# Patient Record
Sex: Female | Born: 1965 | Race: Black or African American | Hispanic: No | Marital: Married | Smoking: Never smoker
Health system: Southern US, Community
[De-identification: ages and names within clinical notes are randomized; demographics above are authoritative.]

---

## 2012-01-18 DIAGNOSIS — K5900 Constipation, unspecified: Secondary | ICD-10-CM | POA: Insufficient documentation

## 2012-01-19 DIAGNOSIS — H93299 Other abnormal auditory perceptions, unspecified ear: Secondary | ICD-10-CM | POA: Insufficient documentation

## 2012-01-19 DIAGNOSIS — H9209 Otalgia, unspecified ear: Secondary | ICD-10-CM | POA: Insufficient documentation

## 2012-01-19 DIAGNOSIS — M26609 Unspecified temporomandibular joint disorder, unspecified side: Secondary | ICD-10-CM | POA: Insufficient documentation

## 2012-02-06 DIAGNOSIS — E162 Hypoglycemia, unspecified: Secondary | ICD-10-CM | POA: Insufficient documentation

## 2012-02-06 DIAGNOSIS — N951 Menopausal and female climacteric states: Secondary | ICD-10-CM | POA: Insufficient documentation

## 2012-03-21 DIAGNOSIS — N390 Urinary tract infection, site not specified: Secondary | ICD-10-CM | POA: Insufficient documentation

## 2012-03-22 DIAGNOSIS — R3129 Other microscopic hematuria: Secondary | ICD-10-CM | POA: Insufficient documentation

## 2012-03-22 DIAGNOSIS — M545 Low back pain, unspecified: Secondary | ICD-10-CM | POA: Insufficient documentation

## 2012-08-23 DIAGNOSIS — M129 Arthropathy, unspecified: Secondary | ICD-10-CM | POA: Insufficient documentation

## 2012-08-23 DIAGNOSIS — H04129 Dry eye syndrome of unspecified lacrimal gland: Secondary | ICD-10-CM | POA: Insufficient documentation

## 2012-08-23 DIAGNOSIS — M25549 Pain in joints of unspecified hand: Secondary | ICD-10-CM | POA: Insufficient documentation

## 2013-03-25 DIAGNOSIS — N644 Mastodynia: Secondary | ICD-10-CM | POA: Insufficient documentation

## 2013-03-25 DIAGNOSIS — N63 Unspecified lump in unspecified breast: Secondary | ICD-10-CM | POA: Insufficient documentation

## 2013-07-08 DIAGNOSIS — K219 Gastro-esophageal reflux disease without esophagitis: Secondary | ICD-10-CM | POA: Insufficient documentation

## 2013-07-18 DIAGNOSIS — R109 Unspecified abdominal pain: Secondary | ICD-10-CM | POA: Insufficient documentation

## 2013-11-07 DIAGNOSIS — N920 Excessive and frequent menstruation with regular cycle: Secondary | ICD-10-CM | POA: Insufficient documentation

## 2015-05-25 ENCOUNTER — Ambulatory Visit (INDEPENDENT_AMBULATORY_CARE_PROVIDER_SITE_OTHER): Payer: Self-pay | Admitting: Emergency Medicine

## 2015-05-25 VITALS — BP 110/60 | HR 58 | Temp 98.8°F | Resp 16 | Ht 67.25 in | Wt 155.0 lb

## 2015-05-25 DIAGNOSIS — Z024 Encounter for examination for driving license: Secondary | ICD-10-CM

## 2015-05-25 DIAGNOSIS — Z021 Encounter for pre-employment examination: Secondary | ICD-10-CM

## 2015-05-25 NOTE — Progress Notes (Signed)
   Subjective:  Patient ID: Marissa Coleman, female    DOB: 1966-04-23  Age: 49 y.o. MRN: [id number]  CC: Employment Physical   HPI Marissa PodCarles Kuennen presents  for a DOT physical she takes no medication has no medical problems.  History Paisli has no past medical history on file.   She has no past surgical history on file.   Her  family history is not on file.  She   reports that she has never smoked. She does not have any smokeless tobacco history on file. Her alcohol and drug histories are not on file.  No outpatient prescriptions prior to visit.   No facility-administered medications prior to visit.    History   Social History  . Marital Status: Divorced    Spouse Name: N/A  . Number of Children: N/A  . Years of Education: N/A   Social History Main Topics  . Smoking status: Never Smoker   . Smokeless tobacco: Not on file  . Alcohol Use: Not on file  . Drug Use: Not on file  . Sexual Activity: Not on file   Other Topics Concern  . None   Social History Narrative  . None     Review of Systems   Review of systems was unremarkable  Objective:  BP 110/60 mmHg  Pulse 58  Temp(Src) 98.8 F (37.1 C) (Oral)  Resp 16  Ht 5' 7.25" (1.708 m)  Wt 155 lb (70.308 kg)  BMI 24.10 kg/m2  SpO2 94%  LMP 05/19/2015  Physical Exam  Constitutional: She is oriented to person, place, and time. She appears well-developed and well-nourished. No distress.  HENT:  Head: Normocephalic and atraumatic.  Right Ear: External ear normal.  Left Ear: External ear normal.  Nose: Nose normal.  Eyes: Conjunctivae and EOM are normal. Pupils are equal, round, and reactive to light. No scleral icterus.  Neck: Normal range of motion. Neck supple. No tracheal deviation present.  Cardiovascular: Normal rate, regular rhythm and normal heart sounds.   Pulmonary/Chest: Effort normal. No respiratory distress. She has no wheezes. She has no rales.  Abdominal: She exhibits no mass. There is no  tenderness. There is no rebound and no guarding.  Musculoskeletal: She exhibits no edema.  Lymphadenopathy:    She has no cervical adenopathy.  Neurological: She is alert and oriented to person, place, and time. Coordination normal.  Skin: Skin is warm and dry. No rash noted.  Psychiatric: She has a normal mood and affect. Her behavior is normal.      Assessment & Plan:   There are no diagnoses linked to this encounter. I am having Ms. Schiffer maintain her Vitamin D3.  Meds ordered this encounter  Medications  . Cholecalciferol (VITAMIN D3) 3000 UNITS TABS    Sig: Take by mouth.    Appropriate red flag conditions were discussed with the patient as well as actions that should be taken.  Patient expressed his understanding.  Follow-up: No Follow-up on file.  Carmelina DaneAnderson, Oneil Behney S, MD

## 2016-03-09 ENCOUNTER — Other Ambulatory Visit: Payer: Self-pay | Admitting: Specialist

## 2016-03-09 DIAGNOSIS — N6310 Unspecified lump in the right breast, unspecified quadrant: Secondary | ICD-10-CM

## 2016-03-16 ENCOUNTER — Other Ambulatory Visit: Payer: Self-pay

## 2016-03-29 ENCOUNTER — Other Ambulatory Visit: Payer: Self-pay | Admitting: Specialist

## 2016-03-29 ENCOUNTER — Ambulatory Visit
Admission: RE | Admit: 2016-03-29 | Discharge: 2016-03-29 | Disposition: A | Discharge status: Home / Self Care | Payer: BC Managed Care – PPO | Source: Ambulatory Visit | Attending: Specialist | Admitting: Specialist

## 2016-03-29 DIAGNOSIS — N6310 Unspecified lump in the right breast, unspecified quadrant: Secondary | ICD-10-CM

## 2016-04-05 ENCOUNTER — Ambulatory Visit
Admission: RE | Admit: 2016-04-05 | Discharge: 2016-04-05 | Disposition: A | Discharge status: Home / Self Care | Payer: BC Managed Care – PPO | Source: Ambulatory Visit | Attending: Specialist | Admitting: Specialist

## 2016-04-05 DIAGNOSIS — N6310 Unspecified lump in the right breast, unspecified quadrant: Secondary | ICD-10-CM

## 2016-04-05 HISTORY — PX: BREAST CYST ASPIRATION: SHX578

## 2017-05-10 ENCOUNTER — Other Ambulatory Visit: Payer: Self-pay | Admitting: Specialist

## 2017-05-10 DIAGNOSIS — Z1231 Encounter for screening mammogram for malignant neoplasm of breast: Secondary | ICD-10-CM

## 2017-05-24 ENCOUNTER — Ambulatory Visit
Admission: RE | Admit: 2017-05-24 | Discharge: 2017-05-24 | Disposition: A | Discharge status: Home / Self Care | Payer: BC Managed Care – PPO | Source: Ambulatory Visit | Attending: Specialist | Admitting: Specialist

## 2017-05-24 DIAGNOSIS — Z1231 Encounter for screening mammogram for malignant neoplasm of breast: Secondary | ICD-10-CM

## 2017-05-28 ENCOUNTER — Other Ambulatory Visit: Payer: Self-pay | Admitting: Specialist

## 2017-05-28 DIAGNOSIS — R928 Other abnormal and inconclusive findings on diagnostic imaging of breast: Secondary | ICD-10-CM

## 2017-05-31 ENCOUNTER — Ambulatory Visit
Admission: RE | Admit: 2017-05-31 | Discharge: 2017-05-31 | Disposition: A | Discharge status: Home / Self Care | Payer: BLUE CROSS/BLUE SHIELD | Source: Ambulatory Visit | Attending: Specialist | Admitting: Specialist

## 2017-05-31 ENCOUNTER — Ambulatory Visit: Payer: BC Managed Care – PPO

## 2017-05-31 ENCOUNTER — Other Ambulatory Visit: Payer: Self-pay | Admitting: Specialist

## 2017-05-31 DIAGNOSIS — R928 Other abnormal and inconclusive findings on diagnostic imaging of breast: Secondary | ICD-10-CM

## 2018-05-09 IMAGING — MG 2D DIGITAL DIAGNOSTIC UNILATERAL LEFT MAMMOGRAM WITH CAD AND ADJ
6 series · 6 of 14 positions shown · non-contrast
Comparison: Previous exam(s).

CLINICAL DATA: Screening recall for a left breast asymmetry. The
patient states that she has had focal pain in the lateral aspect of
the left breast for the past few months. This was not mentioned to
the technologist at the time of her screening mammogram.

EXAM:
2D DIGITAL DIAGNOSTIC UNILATERAL LEFT MAMMOGRAM WITH CAD AND ADJUNCT
TOMO
LEFT BREAST ULTRASOUND

[L MLO]
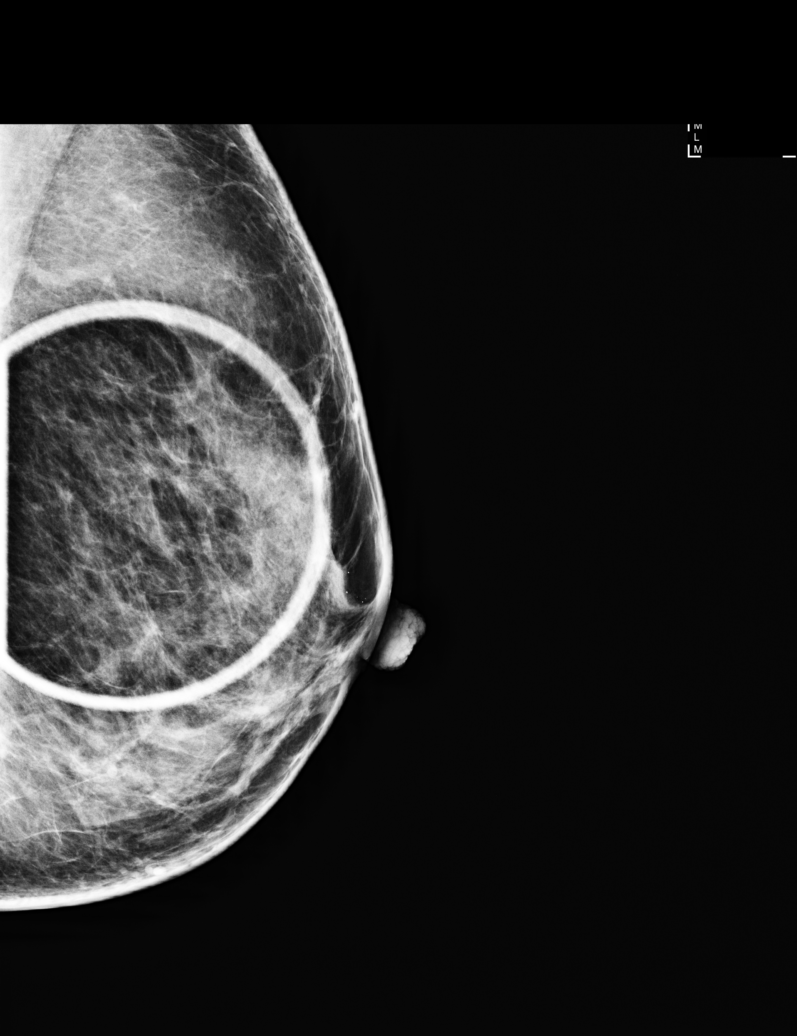

[L MLO synth-2D]
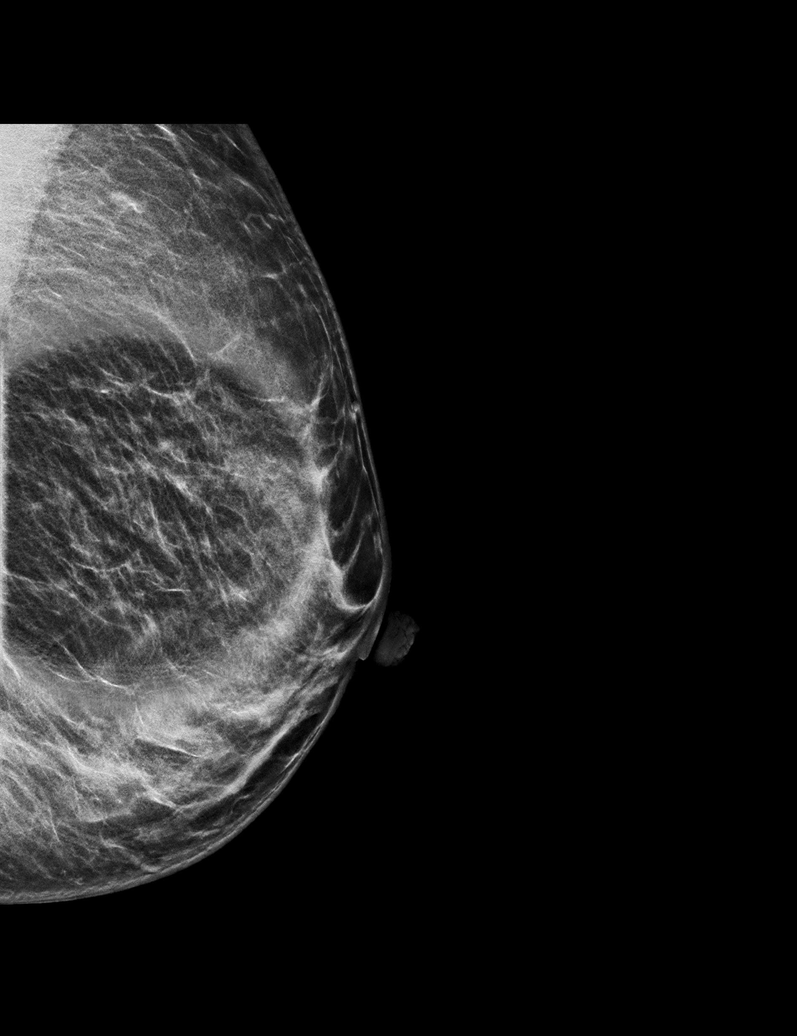

[L CC]
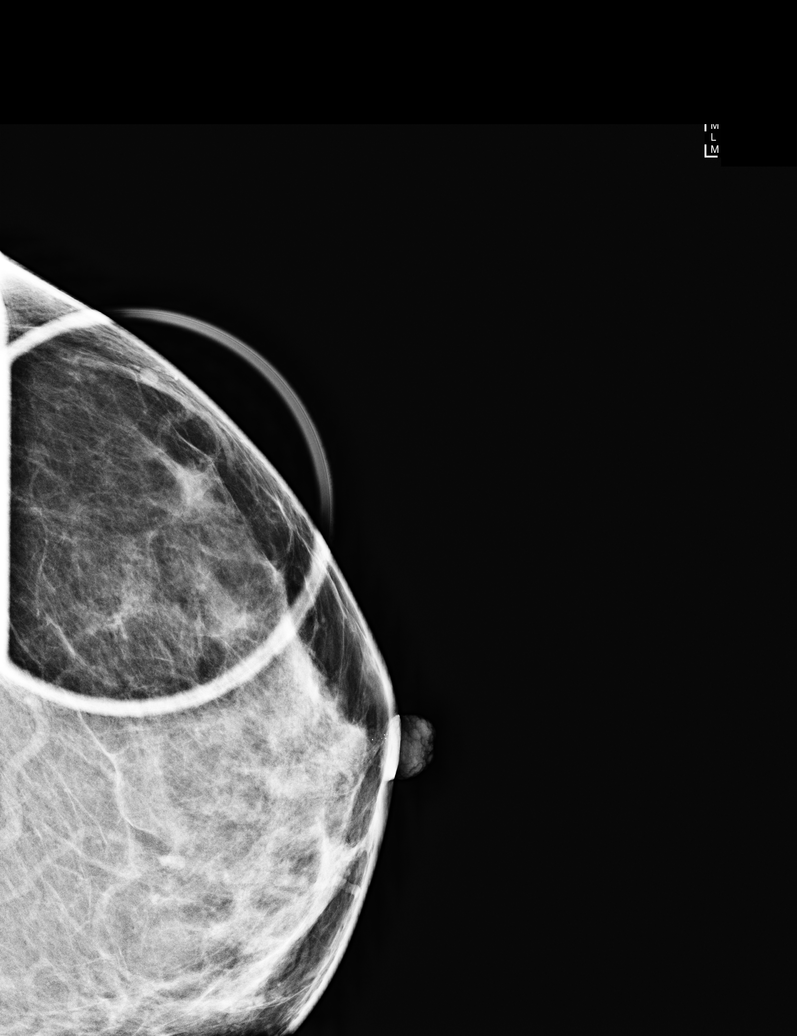

[L CC synth-2D]
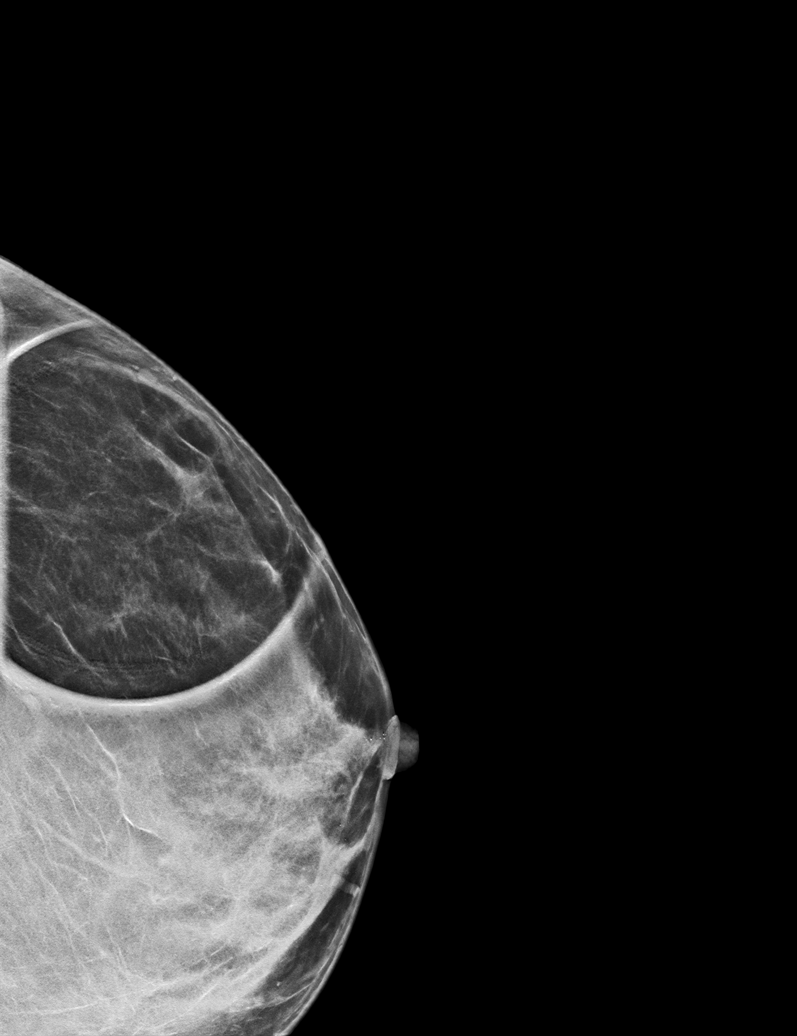

[L MLO tomo · tomo slice 15/28.0]
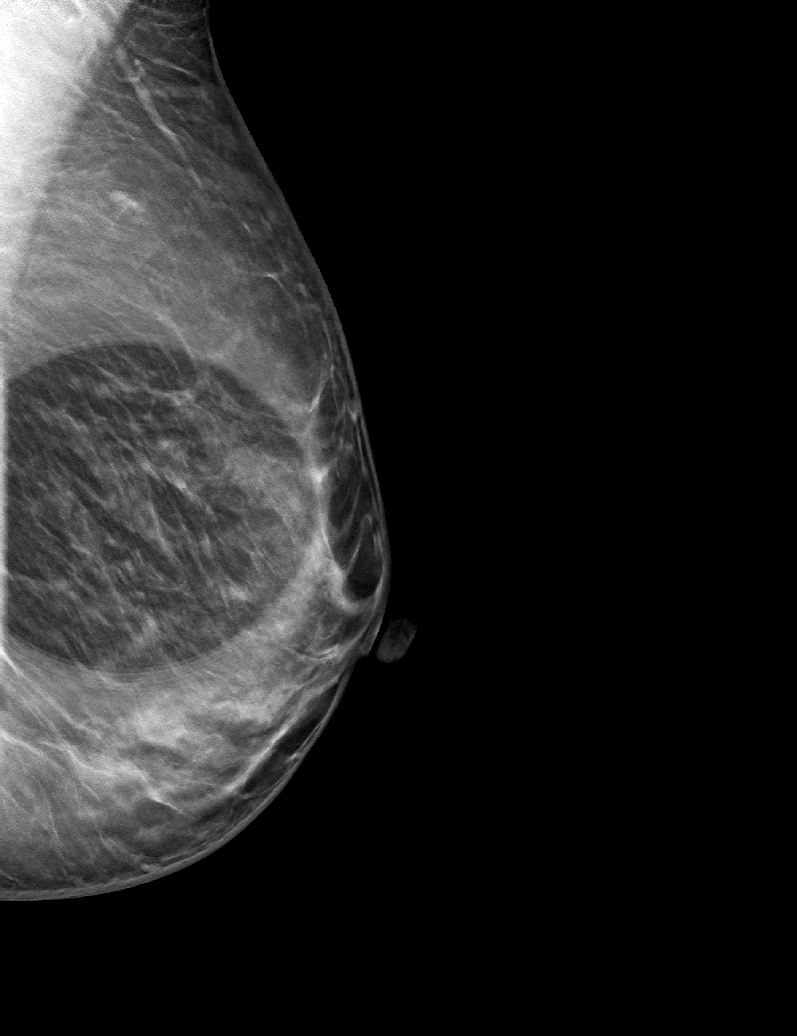

[L CC tomo · tomo slice 27/52.0]
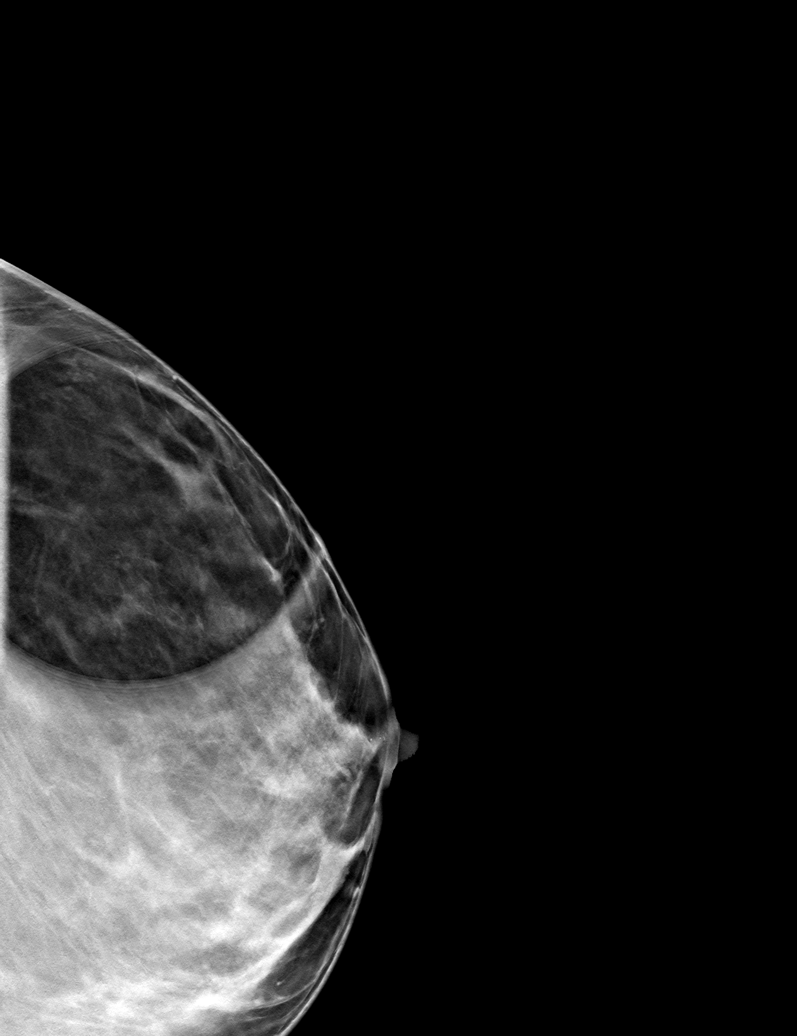

[6 of 14 positions shown; findings below may reference images not displayed]

ACR Breast Density Category c: The breast tissue is heterogeneously
dense, which may obscure small masses.
FINDINGS: The tissue in the lateral aspect of the left breast appears
unchanged. No suspicious calcifications, masses or areas of
distortion are seen in the left breast.

Mammographic images were processed with CAD.

Physical exam of the lateral left breast demonstrates no discrete
palpable masses, though significant tenderness is elicited with
palpation.

Ultrasound targeted to the lateral aspect of the left breast
demonstrates multiple small benign-appearing cysts. There is a
slightly ectatic duct without identified filling defect. No
suspicious masses or areas of shadowing are identified.
IMPRESSION: 1. The asymmetry of concern in the lateral left breast resolves
consistent with overlapping fibroglandular tissue.

2. Fibrocystic changes identified at the site of focal pain, which
may be a cause of pain.

RECOMMENDATION:
1. Clinical follow-up recommended for the painful area of concern in
the left breast. Any further workup should be based on clinical
grounds.

2.  Screening mammogram in one year.(Code:84-P-ECK)

I have discussed the findings and recommendations with the patient.
Results were also provided in writing at the conclusion of the
visit. If applicable, a reminder letter will be sent to the patient
regarding the next appointment.

BI-RADS CATEGORY  2: Benign.

## 2018-11-14 DIAGNOSIS — K5904 Chronic idiopathic constipation: Secondary | ICD-10-CM | POA: Insufficient documentation

## 2020-06-14 DIAGNOSIS — M4726 Other spondylosis with radiculopathy, lumbar region: Secondary | ICD-10-CM | POA: Insufficient documentation

## 2021-03-03 ENCOUNTER — Other Ambulatory Visit: Payer: Self-pay | Admitting: Family Medicine

## 2021-03-03 DIAGNOSIS — Z1231 Encounter for screening mammogram for malignant neoplasm of breast: Secondary | ICD-10-CM

## 2021-03-16 ENCOUNTER — Other Ambulatory Visit: Payer: Self-pay | Admitting: Family Medicine

## 2021-03-16 DIAGNOSIS — G4452 New daily persistent headache (NDPH): Secondary | ICD-10-CM

## 2021-03-16 DIAGNOSIS — H539 Unspecified visual disturbance: Secondary | ICD-10-CM

## 2021-03-29 ENCOUNTER — Other Ambulatory Visit: Payer: BLUE CROSS/BLUE SHIELD

## 2021-04-07 ENCOUNTER — Ambulatory Visit
Admission: RE | Admit: 2021-04-07 | Discharge: 2021-04-07 | Disposition: A | Discharge status: Home / Self Care | Payer: BC Managed Care – PPO | Source: Ambulatory Visit | Attending: Family Medicine | Admitting: Family Medicine

## 2021-04-07 ENCOUNTER — Other Ambulatory Visit: Payer: Self-pay

## 2021-04-07 DIAGNOSIS — H539 Unspecified visual disturbance: Secondary | ICD-10-CM

## 2021-04-07 DIAGNOSIS — G4452 New daily persistent headache (NDPH): Secondary | ICD-10-CM

## 2021-04-07 MED ORDER — GADOBENATE DIMEGLUMINE 529 MG/ML IV SOLN
15.0000 mL | Freq: Once | INTRAVENOUS | Status: AC | PRN
  Administered 2021-04-07: 15 mL via INTRAVENOUS

## 2021-04-24 NOTE — Progress Notes (Signed)
Cardiology Office Note:    Date:  04/26/2021   ID:  Marissa Coleman, DOB 1965-12-09, MRN [id number]  PCP:  Patient, No Pcp Per (Inactive)  Cardiologist:  None  Electrophysiologist:  None   Referring MD: Aliene BeamsHagler, Rachel, MD   Chief Complaint  Patient presents with   Palpitations    History of Present Illness:    Marissa Coleman is a 55 y.o. female with a hx of hypertension, migraines who is referred by Dr. Tracie HarrierHagler for evaluation of tachycardia.  She reports that she has been having episodes of palpitations.  Reports that she would wake up every morning with palpitations where she felt like her heart was racing.  Would last for about 15 to 20 minutes.  Would also occur a few times per week during the day.  No clear triggers.  She was started on metoprolol with improvement in her symptoms, now having palpitations 1-2 times per month.  She does not exercise due to back pain.  She denies any exertional chest pain but does report some dyspnea with exertion, which she attributes to deconditioning.  Reports has been having episodes of lightheadedness if she turns her head too fast.  Denies any syncopal episodes.  Has noted swelling in her legs and starting amlodipine.  Reports that she has been told she snores. Denies any caffeine, alcohol, or tobacco use.  No known history of heart disease in her immediate family.   No past medical history on file.  Past Surgical History:  Procedure Laterality Date   BREAST CYST ASPIRATION Right 04/05/2016    Current Medications: Current Meds  Medication Sig   amLODipine (NORVASC) 10 MG tablet 1 tablet   Cholecalciferol (VITAMIN D3) 3000 UNITS TABS Take by mouth.   cyclobenzaprine (FLEXERIL) 10 MG tablet 1 tablet at bedtime as needed   EPINEPHrine 0.3 mg/0.3 mL IJ SOAJ injection See admin instructions.   Magnesium Oxide 200 MG TABS See admin instructions.   metoprolol succinate (TOPROL-XL) 25 MG 24 hr tablet    Riboflavin 400 MG CAPS 1 capsule     Allergies:    Morphine, Penicillins, and Shellfish allergy   Social History   Socioeconomic History   Marital status: Divorced    Spouse name: Not on file   Number of children: Not on file   Years of education: Not on file   Highest education level: Not on file  Occupational History   Not on file  Tobacco Use   Smoking status: Never   Smokeless tobacco: Not on file  Substance and Sexual Activity   Alcohol use: Not on file   Drug use: Not on file   Sexual activity: Not on file  Other Topics Concern   Not on file  Social History Narrative   Not on file   Social Determinants of Health   Financial Resource Strain: Not on file  Food Insecurity: Not on file  Transportation Needs: Not on file  Physical Activity: Not on file  Stress: Not on file  Social Connections: Not on file     Family History: The patient's family history includes Breast cancer (age of onset: 6945) in her maternal aunt; Breast cancer (age of onset: 1450) in her mother.  ROS:   Please see the history of present illness.     All other systems reviewed and are negative.  EKGs/Labs/Other Studies Reviewed:    The following studies were reviewed today:   EKG:  EKG is ordered today.  The ekg ordered today demonstrates normal sinus  rhythm, rate 62, low voltage, poor R wave progression  Recent Labs: No results found for requested labs within last 8760 hours.  Recent Lipid Panel No results found for: CHOL, TRIG, HDL, CHOLHDL, VLDL, LDLCALC, LDLDIRECT  Physical Exam:    VS:  BP 124/72   Pulse 62   Ht 5\' 5"  (1.651 m)   Wt 213 lb 3.2 oz (96.7 kg)   SpO2 98%   BMI 35.48 kg/m     Wt Readings from Last 3 Encounters:  04/26/21 213 lb 3.2 oz (96.7 kg)  05/25/15 155 lb (70.3 kg)     GEN:  Well nourished, well developed in no acute distress HEENT: Normal NECK: No JVD; No carotid bruits LYMPHATICS: No lymphadenopathy CARDIAC: RRR, no murmurs, rubs, gallops RESPIRATORY:  Clear to auscultation without rales, wheezing or  rhonchi  ABDOMEN: Soft, non-tender, non-distended MUSCULOSKELETAL:  No edema; No deformity  SKIN: Warm and dry NEUROLOGIC:  Alert and oriented x 3 PSYCHIATRIC:  Normal affect   ASSESSMENT:    1. Palpitations   2. Shortness of breath   3. Snoring   4. Essential hypertension    PLAN:    Palpitations: Description concerning for arrhythmia.  Will evaluate with Zio patch x2 weeks.  Recommend holding Toprol-XL while wearing heart monitor.  Given she has been waking up with palpitations, question whether undiagnosed OSA is causing her symptoms.  We will check sleep study  Dyspnea on exertion: Check echocardiogram to evaluate for structural heart disease  Hypertension: On amlodipine 10 mg daily and Toprol-XL 25 mg daily.  Appears controlled.  Snoring: Check sleep study as above.    RTC in 3 months  Medication Adjustments/Labs and Tests Ordered: Current medicines are reviewed at length with the patient today.  Concerns regarding medicines are outlined above.  Orders Placed This Encounter  Procedures   LONG TERM MONITOR (3-14 DAYS)   EKG 12-Lead   ECHOCARDIOGRAM COMPLETE   Split night study   No orders of the defined types were placed in this encounter.   Patient Instructions  Medication Instructions:  Your physician recommends that you continue on your current medications as directed. Please refer to the Current Medication list given to you today.  **Hold metoprolol while wearing monitor**  Testing/Procedures: Your physician has requested that you have an echocardiogram. Echocardiography is a painless test that uses sound waves to create images of your heart. It provides your doctor with information about the size and shape of your heart and how well your heart's chambers and valves are working. This procedure takes approximately one hour. There are no restrictions for this procedure. This will be done at our [facility] location:  [address]  Your  physician has recommended that you have a sleep study. This test records several body functions during sleep, including: brain activity, eye movement, oxygen and carbon dioxide blood levels, heart rate and rhythm, breathing rate and rhythm, the flow of air through your mouth and nose, snoring, body muscle movements, and chest and belly movement.  ZIO XT- Long Term Monitor Instructions   Your physician has requested you wear a ZIO patch monitor for _14__ days.  This is a single patch monitor.   [employer] supplies one patch monitor per enrollment. Additional stickers are not available. Please do not apply patch if you will be having a Nuclear Stress Test, Echocardiogram, Cardiac CT, MRI, or Chest Xray during the period you would be wearing the monitor. The patch cannot be worn during these  tests. You cannot remove and re-apply the ZIO XT patch monitor.  Your ZIO patch monitor will be sent Fed Ex from [employer] directly to your home address. It may take 3-5 days to receive your monitor after you have been enrolled.  Once you have received your monitor, please review the enclosed instructions. Your monitor has already been registered assigning a specific monitor serial # to you.  Billing and Patient Assistance Program Information   We have supplied [employer] with any of your insurance information on file for billing purposes. [employer] offers a sliding scale Patient Assistance Program for patients that do not have insurance, or whose insurance does not completely cover the cost of the ZIO monitor.   You must apply for the Patient Assistance Program to qualify for this discounted rate.     To apply, please call [employer] at [phone], select option 4, then select option 2, and ask to apply for Patient Assistance Program.  Meredeth Ide will ask your household income, and how many people are in your household.  They will quote your out-of-pocket cost based on that information.  [employer] will also be able to set  up a 35-month, interest-free payment plan if needed.  Applying the monitor   Shave hair from upper left chest.  Hold abrader disc by orange tab. Rub abrader in 40 strokes over the upper left chest as indicated in your monitor instructions.  Clean area with 4 enclosed alcohol pads. Let dry.  Apply patch as indicated in monitor instructions. Patch will be placed under collarbone on left side of chest with arrow pointing upward.  Rub patch adhesive wings for 2 minutes. Remove white label marked "1". Remove the white label marked "2". Rub patch adhesive wings for 2 additional minutes.  While looking in a mirror, press and release button in center of patch. A small green light will flash 3-4 times. This will be your only indicator that the monitor has been turned on. ?  Do not shower for the first 24 hours. You may shower after the first 24 hours.  Press the button if you feel a symptom. You will hear a small click. Record Date, Time and Symptom in the Patient Logbook.  When you are ready to remove the patch, follow instructions on the last 2 pages of the Patient Logbook. Stick patch monitor onto the last page of Patient Logbook.  Place Patient Logbook in the blue and white box.  Use locking tab on box and tape box closed securely.  The blue and white box has prepaid postage on it. Please place it in the mailbox as soon as possible. Your physician should have your test results approximately 7 days after the monitor has been mailed back to [facility].  Call [facility] Customer Care at [phone] if you have questions regarding your ZIO XT patch monitor. Call them immediately if you see an orange light blinking on your monitor.  If your monitor falls off in less than 4 days, contact our Monitor department at [phone]. ?If your monitor becomes loose or falls off after 4 days call [employer] at [phone] for suggestions on securing your monitor.?    Follow-Up: At [facility], you and  your health needs are our priority.  As part of our continuing mission to provide you with exceptional heart care, we have created designated Provider Care Teams.  These Care Teams include your primary Cardiologist (physician) and Advanced Practice Providers (APPs -  Physician Assistants and Nurse Practitioners) who all work together  to provide you with the care you need, when you need it.  We recommend signing up for the patient portal called "MyChart".  Sign up information is provided on this After Visit Summary.  MyChart is used to connect with patients for Virtual Visits (Telemedicine).  Patients are able to view lab/test results, encounter notes, upcoming appointments, etc.  Non-urgent messages can be sent to your provider as well.   To learn more about what you can do with MyChart, go to [email or web address].    Your next appointment:   3 month(s)  The format for your next appointment:   In Person  Provider:   Epifanio Lesches, MD       Signed, Little Ishikawa, MD  04/26/2021 2:18 PM    [facility]

## 2021-04-26 ENCOUNTER — Ambulatory Visit
Admission: RE | Admit: 2021-04-26 | Discharge: 2021-04-26 | Disposition: A | Discharge status: Home / Self Care | Payer: BLUE CROSS/BLUE SHIELD | Source: Ambulatory Visit | Attending: Family Medicine | Admitting: Family Medicine

## 2021-04-26 ENCOUNTER — Ambulatory Visit (INDEPENDENT_AMBULATORY_CARE_PROVIDER_SITE_OTHER): Payer: BC Managed Care – PPO

## 2021-04-26 ENCOUNTER — Ambulatory Visit: Payer: BC Managed Care – PPO | Admitting: Cardiology

## 2021-04-26 ENCOUNTER — Encounter: Payer: Self-pay | Admitting: Cardiology

## 2021-04-26 ENCOUNTER — Other Ambulatory Visit: Payer: Self-pay

## 2021-04-26 VITALS — BP 124/72 | HR 62 | Ht 65.0 in | Wt 213.2 lb

## 2021-04-26 DIAGNOSIS — Z1231 Encounter for screening mammogram for malignant neoplasm of breast: Secondary | ICD-10-CM

## 2021-04-26 DIAGNOSIS — R002 Palpitations: Secondary | ICD-10-CM

## 2021-04-26 DIAGNOSIS — I10 Essential (primary) hypertension: Secondary | ICD-10-CM | POA: Diagnosis not present

## 2021-04-26 DIAGNOSIS — R0602 Shortness of breath: Secondary | ICD-10-CM

## 2021-04-26 DIAGNOSIS — R0683 Snoring: Secondary | ICD-10-CM | POA: Diagnosis not present

## 2021-04-26 NOTE — Patient Instructions (Signed)
Medication Instructions:  Your physician recommends that you continue on your current medications as directed. Please refer to the Current Medication list given to you today.  **Hold metoprolol while wearing monitor**  Testing/Procedures: Your physician has requested that you have an echocardiogram. Echocardiography is a painless test that uses sound waves to create images of your heart. It provides your doctor with information about the size and shape of your heart and how well your heart's chambers and valves are working. This procedure takes approximately one hour. There are no restrictions for this procedure. This will be done at our [facility] location:  [address]  Your physician has recommended that you have a sleep study. This test records several body functions during sleep, including: brain activity, eye movement, oxygen and carbon dioxide blood levels, heart rate and rhythm, breathing rate and rhythm, the flow of air through your mouth and nose, snoring, body muscle movements, and chest and belly movement.  ZIO XT- Long Term Monitor Instructions   Your physician has requested you wear a ZIO patch monitor for _14__ days.  This is a single patch monitor.   [employer] supplies one patch monitor per enrollment. Additional stickers are not available. Please do not apply patch if you will be having a Nuclear Stress Test, Echocardiogram, Cardiac CT, MRI, or Chest Xray during the period you would be wearing the monitor. The patch cannot be worn during these tests. You cannot remove and re-apply the ZIO XT patch monitor.  Your ZIO patch monitor will be sent Fed Ex from [employer] directly to your home address. It may take 3-5 days to receive your monitor after you have been enrolled.  Once you have received your monitor, please review the enclosed instructions. Your monitor has already been registered assigning a specific monitor serial # to you.  Billing and Patient  Assistance Program Information   We have supplied [employer] with any of your insurance information on file for billing purposes. [employer] offers a sliding scale Patient Assistance Program for patients that do not have insurance, or whose insurance does not completely cover the cost of the ZIO monitor.   You must apply for the Patient Assistance Program to qualify for this discounted rate.     To apply, please call [employer] at [phone], select option 4, then select option 2, and ask to apply for Patient Assistance Program.  Meredeth Ide will ask your household income, and how many people are in your household.  They will quote your out-of-pocket cost based on that information.  [employer] will also be able to set up a 81-month, interest-free payment plan if needed.  Applying the monitor   Shave hair from upper left chest.  Hold abrader disc by orange tab. Rub abrader in 40 strokes over the upper left chest as indicated in your monitor instructions.  Clean area with 4 enclosed alcohol pads. Let dry.  Apply patch as indicated in monitor instructions. Patch will be placed under collarbone on left side of chest with arrow pointing upward.  Rub patch adhesive wings for 2 minutes. Remove white label marked "1". Remove the white label marked "2". Rub patch adhesive wings for 2 additional minutes.  While looking in a mirror, press and release button in center of patch. A small green light will flash 3-4 times. This will be your only indicator that the monitor has been turned on. ?  Do not shower for the first 24 hours. You may shower after the first 24  hours.  Press the button if you feel a symptom. You will hear a small click. Record Date, Time and Symptom in the Patient Logbook.  When you are ready to remove the patch, follow instructions on the last 2 pages of the Patient Logbook. Stick patch monitor onto the last page of Patient Logbook.  Place Patient Logbook in the blue and white box.  Use locking tab on box  and tape box closed securely.  The blue and white box has prepaid postage on it. Please place it in the mailbox as soon as possible. Your physician should have your test results approximately 7 days after the monitor has been mailed back to [facility].  Call [facility] Customer Care at [phone] if you have questions regarding your ZIO XT patch monitor. Call them immediately if you see an orange light blinking on your monitor.  If your monitor falls off in less than 4 days, contact our Monitor department at [phone]. ?If your monitor becomes loose or falls off after 4 days call [employer] at [phone] for suggestions on securing your monitor.?    Follow-Up: At [facility], you and your health needs are our priority.  As part of our continuing mission to provide you with exceptional heart care, we have created designated Provider Care Teams.  These Care Teams include your primary Cardiologist (physician) and Advanced Practice Providers (APPs -  Physician Assistants and Nurse Practitioners) who all work together to provide you with the care you need, when you need it.  We recommend signing up for the patient portal called "MyChart".  Sign up information is provided on this After Visit Summary.  MyChart is used to connect with patients for Virtual Visits (Telemedicine).  Patients are able to view lab/test results, encounter notes, upcoming appointments, etc.  Non-urgent messages can be sent to your provider as well.   To learn more about what you can do with MyChart, go to [email or web address].    Your next appointment:   3 month(s)  The format for your next appointment:   In Person  Provider:   Epifanio Lesches, MD

## 2021-04-26 NOTE — Progress Notes (Unsigned)
Patient enrolled for [employer] to mail a 14 day ZIO XT monitor to address on file. 

## 2021-04-29 DIAGNOSIS — R002 Palpitations: Secondary | ICD-10-CM | POA: Diagnosis not present

## 2021-05-02 ENCOUNTER — Telehealth: Payer: Self-pay | Admitting: *Deleted

## 2021-05-02 NOTE — Telephone Encounter (Signed)
Sleep study appointment left on cell voice mail.

## 2021-05-10 ENCOUNTER — Other Ambulatory Visit: Payer: Self-pay

## 2021-05-10 ENCOUNTER — Ambulatory Visit (HOSPITAL_COMMUNITY): Payer: BC Managed Care – PPO | Attending: Cardiology

## 2021-05-10 DIAGNOSIS — R0602 Shortness of breath: Secondary | ICD-10-CM | POA: Diagnosis present

## 2021-05-10 LAB — ECHOCARDIOGRAM COMPLETE
Area-P 1/2: 3.54 cm2
S' Lateral: 2.7 cm

## 2021-05-24 ENCOUNTER — Other Ambulatory Visit (HOSPITAL_COMMUNITY): Payer: BC Managed Care – PPO

## 2021-05-27 ENCOUNTER — Other Ambulatory Visit: Payer: Self-pay

## 2021-05-27 ENCOUNTER — Telehealth: Payer: Self-pay | Admitting: Cardiology

## 2021-05-27 MED ORDER — METOPROLOL SUCCINATE ER 25 MG PO TB24: 25.0000 mg | ORAL_TABLET | Freq: Every day | ORAL | 1 refills | Status: DC

## 2021-05-27 NOTE — Telephone Encounter (Signed)
Patient returning call for monitor results. She states she is working so if she does not answer to leave a detailed message of the results on her voicemail.

## 2021-05-27 NOTE — Telephone Encounter (Signed)
Called patient, LVM advising that she had the monitor results per note from [facility], Charity fundraiser. I did not see anything new at this time. But to call back for questions or concerns.

## 2021-05-27 NOTE — Telephone Encounter (Signed)
See previous message in results- that Dr.Schumann agreed if Metoprolol was helping symptoms to continue. Called patient back, spoke with her, advised of this- sent in RX to pharmacy per note from Dr.Schumann Metoprolol 25 mg daily. Sent to verified pharmacy.  Patient verbalized understanding.

## 2021-07-01 ENCOUNTER — Encounter (HOSPITAL_BASED_OUTPATIENT_CLINIC_OR_DEPARTMENT_OTHER): Payer: BC Managed Care – PPO | Admitting: Cardiovascular Disease

## 2021-08-26 ENCOUNTER — Ambulatory Visit: Payer: BC Managed Care – PPO | Admitting: Cardiology

## 2021-10-25 ENCOUNTER — Telehealth: Payer: Self-pay

## 2021-10-25 NOTE — Telephone Encounter (Signed)
Letter has been sent to patient informing them that their sleep study has expired. Patient will need to call and schedule an office visit to re-evaluate the need for a sleep study.    

## 2021-11-24 DIAGNOSIS — M2550 Pain in unspecified joint: Secondary | ICD-10-CM | POA: Diagnosis not present

## 2021-12-13 DIAGNOSIS — M5432 Sciatica, left side: Secondary | ICD-10-CM | POA: Diagnosis not present

## 2021-12-13 DIAGNOSIS — U071 COVID-19: Secondary | ICD-10-CM | POA: Diagnosis not present

## 2021-12-13 DIAGNOSIS — M4326 Fusion of spine, lumbar region: Secondary | ICD-10-CM | POA: Diagnosis not present

## 2021-12-13 DIAGNOSIS — H6982 Other specified disorders of Eustachian tube, left ear: Secondary | ICD-10-CM | POA: Diagnosis not present

## 2021-12-13 DIAGNOSIS — M961 Postlaminectomy syndrome, not elsewhere classified: Secondary | ICD-10-CM | POA: Diagnosis not present

## 2021-12-13 DIAGNOSIS — M4726 Other spondylosis with radiculopathy, lumbar region: Secondary | ICD-10-CM | POA: Diagnosis not present

## 2021-12-20 DIAGNOSIS — Z981 Arthrodesis status: Secondary | ICD-10-CM | POA: Diagnosis not present

## 2021-12-20 DIAGNOSIS — M545 Low back pain, unspecified: Secondary | ICD-10-CM | POA: Diagnosis not present

## 2021-12-20 DIAGNOSIS — M4726 Other spondylosis with radiculopathy, lumbar region: Secondary | ICD-10-CM | POA: Diagnosis not present

## 2021-12-20 DIAGNOSIS — R200 Anesthesia of skin: Secondary | ICD-10-CM | POA: Diagnosis not present

## 2021-12-20 DIAGNOSIS — M79605 Pain in left leg: Secondary | ICD-10-CM | POA: Diagnosis not present

## 2022-01-03 DIAGNOSIS — M7062 Trochanteric bursitis, left hip: Secondary | ICD-10-CM | POA: Diagnosis not present

## 2022-01-03 DIAGNOSIS — M4326 Fusion of spine, lumbar region: Secondary | ICD-10-CM | POA: Diagnosis not present

## 2022-01-03 DIAGNOSIS — M961 Postlaminectomy syndrome, not elsewhere classified: Secondary | ICD-10-CM | POA: Diagnosis not present

## 2022-01-20 DIAGNOSIS — M25552 Pain in left hip: Secondary | ICD-10-CM | POA: Diagnosis not present

## 2022-01-20 DIAGNOSIS — M7062 Trochanteric bursitis, left hip: Secondary | ICD-10-CM | POA: Diagnosis not present

## 2022-01-31 DIAGNOSIS — M4726 Other spondylosis with radiculopathy, lumbar region: Secondary | ICD-10-CM | POA: Diagnosis not present

## 2022-01-31 DIAGNOSIS — M4326 Fusion of spine, lumbar region: Secondary | ICD-10-CM | POA: Diagnosis not present

## 2022-01-31 DIAGNOSIS — M961 Postlaminectomy syndrome, not elsewhere classified: Secondary | ICD-10-CM | POA: Diagnosis not present

## 2022-02-09 DIAGNOSIS — Z6829 Body mass index (BMI) 29.0-29.9, adult: Secondary | ICD-10-CM | POA: Diagnosis not present

## 2022-02-09 DIAGNOSIS — M47816 Spondylosis without myelopathy or radiculopathy, lumbar region: Secondary | ICD-10-CM | POA: Diagnosis not present

## 2022-02-09 DIAGNOSIS — M4326 Fusion of spine, lumbar region: Secondary | ICD-10-CM | POA: Diagnosis not present

## 2022-02-09 DIAGNOSIS — M961 Postlaminectomy syndrome, not elsewhere classified: Secondary | ICD-10-CM | POA: Diagnosis not present

## 2022-03-08 DIAGNOSIS — M47816 Spondylosis without myelopathy or radiculopathy, lumbar region: Secondary | ICD-10-CM | POA: Diagnosis not present

## 2022-04-06 DIAGNOSIS — G894 Chronic pain syndrome: Secondary | ICD-10-CM | POA: Diagnosis not present

## 2022-04-11 DIAGNOSIS — M549 Dorsalgia, unspecified: Secondary | ICD-10-CM | POA: Diagnosis not present

## 2022-04-11 DIAGNOSIS — M40204 Unspecified kyphosis, thoracic region: Secondary | ICD-10-CM | POA: Diagnosis not present

## 2022-04-11 DIAGNOSIS — M5134 Other intervertebral disc degeneration, thoracic region: Secondary | ICD-10-CM | POA: Diagnosis not present

## 2022-04-20 DIAGNOSIS — M4326 Fusion of spine, lumbar region: Secondary | ICD-10-CM | POA: Diagnosis not present

## 2022-04-20 DIAGNOSIS — M961 Postlaminectomy syndrome, not elsewhere classified: Secondary | ICD-10-CM | POA: Diagnosis not present

## 2022-04-20 DIAGNOSIS — M546 Pain in thoracic spine: Secondary | ICD-10-CM | POA: Diagnosis not present

## 2022-04-20 DIAGNOSIS — M47816 Spondylosis without myelopathy or radiculopathy, lumbar region: Secondary | ICD-10-CM | POA: Diagnosis not present

## 2022-05-25 DIAGNOSIS — M961 Postlaminectomy syndrome, not elsewhere classified: Secondary | ICD-10-CM | POA: Diagnosis not present

## 2022-05-25 DIAGNOSIS — M47816 Spondylosis without myelopathy or radiculopathy, lumbar region: Secondary | ICD-10-CM | POA: Diagnosis not present

## 2022-05-25 DIAGNOSIS — M4326 Fusion of spine, lumbar region: Secondary | ICD-10-CM | POA: Diagnosis not present

## 2022-05-25 DIAGNOSIS — M546 Pain in thoracic spine: Secondary | ICD-10-CM | POA: Diagnosis not present

## 2022-06-13 ENCOUNTER — Other Ambulatory Visit: Payer: Self-pay

## 2022-06-13 ENCOUNTER — Ambulatory Visit: Payer: BC Managed Care – PPO | Attending: Family Medicine

## 2022-06-13 DIAGNOSIS — M5416 Radiculopathy, lumbar region: Secondary | ICD-10-CM | POA: Insufficient documentation

## 2022-06-13 DIAGNOSIS — M25552 Pain in left hip: Secondary | ICD-10-CM | POA: Diagnosis not present

## 2022-06-13 NOTE — Therapy (Signed)
OUTPATIENT PHYSICAL THERAPY THORACOLUMBAR EVALUATION   Patient Name: Marissa Coleman MRN: [id number] DOB:01-Oct-1966, 56 y.o., female Today's Date: 06/13/2022   PT End of Session - 06/13/22 1045     Visit Number 1    Number of Visits 12    Date for PT Re-Evaluation 07/28/22    PT Start Time 1038    PT Stop Time 1113    PT Time Calculation (min) 35 min    Activity Tolerance Patient limited by pain    Behavior During Therapy [facility] for tasks assessed/performed             History reviewed. No pertinent past medical history. Past Surgical History:  Procedure Laterality Date   BREAST CYST ASPIRATION Right 04/05/2016   There are no problems to display for this patient.  REFERRING PROVIDER: Theda Sers, PA-C  REFERRING DIAG: Postlaminectomy syndrome  Rationale for Evaluation and Treatment Rehabilitation  THERAPY DIAG:  Radiculopathy, lumbar region  ONSET DATE: 11-12 years ago   SUBJECTIVE:                                                                                                                                                                                           SUBJECTIVE STATEMENT: She notes that she had surgery about 2 years ago to repair two ruptured discs, but this does not seem to have helped. She had multiple surgeries to reduce her pain. She had tried physical therapy before, but she feels that it made it worse. She continues to feel the "electricity" that she felt prior to her lumbar surgeries. She feels that her pain is steadily getting worse. She has begun to be able to "feel it coming."  PERTINENT HISTORY:  Multiple lumbar surgeries  PAIN:  Are you having pain? Yes: NPRS scale: 9/10 Pain location: left groin, low back radiating into left leg Pain description: "electricity", constant Aggravating factors: sitting, standing, or laying down for long periods Relieving factors: medication   PRECAUTIONS: None  WEIGHT BEARING RESTRICTIONS  No  FALLS:  Has patient fallen in last 6 months? No  LIVING ENVIRONMENT: Lives with: lives with their family Lives in: House/apartment Stairs: No Has following equipment at home: None  OCCUPATION: works at group home; prolonged standing and walking,   PLOF: Independent  NEXT MD FOLLOW UP: later today (8/1/230  PATIENT GOALS reduced pain, stand, sit, and sleep better and longer, be able to go see her children   OBJECTIVE:   SCREENING FOR RED FLAGS: Bowel or bladder incontinence: Yes: she has talked to physician and was told that this was "normal"  Spinal tumors: No Cauda equina syndrome: No Compression  fracture: No Abdominal aneurysm: No  COGNITION:  Overall cognitive status: Within functional limits for tasks assessed     SENSATION: Patient reports constant numbness in LLE  POSTURE: rounded shoulders, forward head, and weight shift right  PALPATION: TTP: left lumbar paraspinals, psoas, IT band, TFL, gluteals,   LUMBAR ROM:   Active  A/PROM  eval  Flexion 60  Extension 10  Right lateral flexion   Left lateral flexion   Right rotation 75% limited  Left rotation 75% limited   (Blank rows = not tested)   LOWER EXTREMITY MMT:    MMT Right eval Left eval  Hip flexion 4/5 3+/5  Hip extension    Hip abduction    Hip adduction    Hip internal rotation    Hip external rotation    Knee flexion 3+/5 3+/5  Knee extension 4/5 3+/5  Ankle dorsiflexion 4-/5 3+/5  Ankle plantarflexion    Ankle inversion    Ankle eversion     (Blank rows = not tested)  LUMBAR SPECIAL TESTS:  Unable to be performed due to increased low back pain  GAIT: Assistive device utilized: Single point cane Level of assistance: Modified independence Comments: decreased gait speed, hip extension, stride length     TODAY'S TREATMENT     PATIENT EDUCATION:  Education details: benefits of water, prognosis, plan of care Person educated: Patient Education method:  Explanation Education comprehension: verbalized understanding   HOME EXERCISE PROGRAM:   ASSESSMENT:  CLINICAL IMPRESSION: Patient is a 56 y.o. female who was seen today for physical therapy evaluation and treatment for chronic low back. She presented with high pain severity and irritability with palpation to her lumbar musculature being the most aggravating to her familiar symptoms. She exhibited limited lumbar AROM and left lower extremity weakness compared to the right. She would benefit from skilled physical therapy to address her remaining impairments to maximize her functional mobility.    OBJECTIVE IMPAIRMENTS Abnormal gait, decreased activity tolerance, decreased mobility, difficulty walking, decreased ROM, decreased strength, hypomobility, increased muscle spasms, impaired flexibility, impaired sensation, impaired tone, postural dysfunction, and pain.   ACTIVITY LIMITATIONS carrying, lifting, bending, sitting, standing, squatting, sleeping, transfers, bed mobility, dressing, locomotion level, and caring for others  PARTICIPATION LIMITATIONS: cleaning, driving, shopping, community activity, occupation, and yard work  PERSONAL FACTORS Past/current experiences, Profession, Time since onset of injury/illness/exacerbation, and 1 comorbidity: chronic pain  are also affecting patient's functional outcome.   REHAB POTENTIAL: Fair    CLINICAL DECISION MAKING: Evolving/moderate complexity  EVALUATION COMPLEXITY: Moderate   GOALS: Goals reviewed with patient? Yes  SHORT TERM GOALS: Target date: 07/04/2022  Patient will be independent with her initial HEP.  Baseline: Goal status: INITIAL  2.  Patient will be able to complete her daily activities without her familiar pain exceeding 7/10.  Baseline:  Goal status: INITIAL   LONG TERM GOALS: Target date: 07/25/2022  Patient will be independent with her advanced HEP.  Baseline:  Goal status: INITIAL  2.  Patient will be able to  ambulate with no significant gait deviations.  Baseline:  Goal status: INITIAL  3.  Patient will be able to improve her perform lumbar rotation to at least 50% of normal for improved function with her daily activities.  Baseline:  Goal status: INITIAL  4.  Patient will be able to complete her critical job demands without her familiar back pain exceeding 5/10. Baseline:  Goal status: INITIAL  PLAN: PT FREQUENCY: 2x/week  PT DURATION: 6 weeks  PLANNED INTERVENTIONS:  Therapeutic exercises, Therapeutic activity, Neuromuscular re-education, Balance training, Gait training, Patient/Family education, Self Care, Joint mobilization, Electrical stimulation, Spinal mobilization, Cryotherapy, Moist heat, Manual therapy, and Re-evaluation.  PLAN FOR NEXT SESSION: nustep, lumbar stabilization, and modalities as needed   Granville Lewis, PT 06/13/2022, 7:07 PM

## 2022-06-14 ENCOUNTER — Encounter: Payer: Self-pay | Admitting: Physical Therapy

## 2022-06-14 ENCOUNTER — Ambulatory Visit: Payer: BC Managed Care – PPO | Admitting: Physical Therapy

## 2022-06-14 DIAGNOSIS — M5416 Radiculopathy, lumbar region: Secondary | ICD-10-CM

## 2022-06-14 NOTE — Therapy (Signed)
OUTPATIENT PHYSICAL THERAPY THORACOLUMBAR TREATMENT   Patient Name: Marissa Coleman MRN: [id number] DOB:02-Jun-1966, 56 y.o., female Today's Date: 06/14/2022   PT End of Session - 06/14/22 1120     Visit Number 2    Number of Visits 12    Date for PT Re-Evaluation 07/28/22    PT Start Time 1120    PT Stop Time 1203    PT Time Calculation (min) 43 min    Activity Tolerance Patient limited by pain    Behavior During Therapy [facility] for tasks assessed/performed             History reviewed. No pertinent past medical history. Past Surgical History:  Procedure Laterality Date   BREAST CYST ASPIRATION Right 04/05/2016   There are no problems to display for this patient.  REFERRING PROVIDER: Theda Sers, PA-C  REFERRING DIAG: Postlaminectomy syndrome  Rationale for Evaluation and Treatment Rehabilitation  THERAPY DIAG:  Radiculopathy, lumbar region  ONSET DATE: 11-12 years ago   SUBJECTIVE:                                                                                                                                                                                           SUBJECTIVE STATEMENT: Had injection that she was hopeful about but pain returned about 10-11 pm. Has already had two Tylenols this morning.  PERTINENT HISTORY:  Multiple lumbar surgeries  PAIN:  Are you having pain? Yes: NPRS scale: 3/10 Pain location: left groin, low back radiating into left leg Pain description: "electricity", constant Aggravating factors: sitting, standing, or laying down for long periods Relieving factors: medication   PRECAUTIONS: None  WEIGHT BEARING RESTRICTIONS No    OBJECTIVE:   TODAY'S TREATMENT                                     EXERCISE LOG  Exercise Repetitions and Resistance Comments  Nustep L3, seat 11 x12 min LEs only  Core press X15 reps 5 sec holds   Glute squeeze X15 reps 5 sec holds   Hip flexion in standing X15 reps With core activation        Blank cell = exercise not performed today    PATIENT EDUCATION:  Education details: benefits of water, prognosis, plan of care Person educated: Patient Education method: Explanation Education comprehension: verbalized understanding   HOME EXERCISE PROGRAM:   ASSESSMENT:  CLINICAL IMPRESSION: Patient presented in clinic with reports of continued LBP which returned unfortunately following injections yesterday. Patient able to tolerate all therex fairly well although prolonged supine is  not comfortable per patient. Any activity tolerance is dependent on the symptoms that she is experiencing for that day. Patient able to tolerate standing exercises with isometrics and core activation. Normal stimulation response noted following removal of the modality. Patient has TENS at home and educated that she could use on her hip for pain control at home as well.   OBJECTIVE IMPAIRMENTS Abnormal gait, decreased activity tolerance, decreased mobility, difficulty walking, decreased ROM, decreased strength, hypomobility, increased muscle spasms, impaired flexibility, impaired sensation, impaired tone, postural dysfunction, and pain.   ACTIVITY LIMITATIONS carrying, lifting, bending, sitting, standing, squatting, sleeping, transfers, bed mobility, dressing, locomotion level, and caring for others  PARTICIPATION LIMITATIONS: cleaning, driving, shopping, community activity, occupation, and yard work  PERSONAL FACTORS Past/current experiences, Profession, Time since onset of injury/illness/exacerbation, and 1 comorbidity: chronic pain  are also affecting patient's functional outcome.   REHAB POTENTIAL: Fair    CLINICAL DECISION MAKING: Evolving/moderate complexity  EVALUATION COMPLEXITY: Moderate   GOALS: Goals reviewed with patient? Yes  SHORT TERM GOALS: Target date: 07/04/2022  Patient will be independent with her initial HEP.  Baseline: Goal status: INITIAL  2.  Patient will be able to  complete her daily activities without her familiar pain exceeding 7/10.  Baseline:  Goal status: INITIAL   LONG TERM GOALS: Target date: 07/25/2022  Patient will be independent with her advanced HEP.  Baseline:  Goal status: INITIAL  2.  Patient will be able to ambulate with no significant gait deviations.  Baseline:  Goal status: INITIAL  3.  Patient will be able to improve her perform lumbar rotation to at least 50% of normal for improved function with her daily activities.  Baseline:  Goal status: INITIAL  4.  Patient will be able to complete her critical job demands without her familiar back pain exceeding 5/10. Baseline:  Goal status: INITIAL  PLAN: PT FREQUENCY: 2x/week  PT DURATION: 6 weeks  PLANNED INTERVENTIONS: Therapeutic exercises, Therapeutic activity, Neuromuscular re-education, Balance training, Gait training, Patient/Family education, Self Care, Joint mobilization, Electrical stimulation, Spinal mobilization, Cryotherapy, Moist heat, Manual therapy, and Re-evaluation.  PLAN FOR NEXT SESSION: nustep, lumbar stabilization, and modalities as needed   Marvell Fuller, PTA 06/14/2022, 12:04 PM

## 2022-06-19 ENCOUNTER — Ambulatory Visit: Payer: BC Managed Care – PPO | Admitting: Physical Therapy

## 2022-06-19 ENCOUNTER — Encounter: Payer: Self-pay | Admitting: Physical Therapy

## 2022-06-19 DIAGNOSIS — M5416 Radiculopathy, lumbar region: Secondary | ICD-10-CM

## 2022-06-19 NOTE — Therapy (Signed)
OUTPATIENT PHYSICAL THERAPY THORACOLUMBAR TREATMENT   Patient Name: Marissa Coleman MRN: [id number] DOB:May 10, 1966, 56 y.o., female Today's Date: 06/19/2022   PT End of Session - 06/19/22 1304     Visit Number 3    Number of Visits 12    Date for PT Re-Evaluation 07/28/22    PT Start Time 1304    PT Stop Time 1344    PT Time Calculation (min) 40 min    Activity Tolerance Patient limited by pain    Behavior During Therapy [facility] for tasks assessed/performed             History reviewed. No pertinent past medical history. Past Surgical History:  Procedure Laterality Date   BREAST CYST ASPIRATION Right 04/05/2016   There are no problems to display for this patient.  REFERRING PROVIDER: Theda Sers, PA-C  REFERRING DIAG: Postlaminectomy syndrome  Rationale for Evaluation and Treatment Rehabilitation  THERAPY DIAG:  Radiculopathy, lumbar region  ONSET DATE: 11-12 years ago   SUBJECTIVE:                                                                                                                                                                                           SUBJECTIVE STATEMENT: States that her pain is the same as before but equating it to the weather today.  PERTINENT HISTORY:  Multiple lumbar surgeries  PAIN:  Are you having pain? Yes: NPRS scale: 4/10 Pain location: left groin, low back radiating into left leg Pain description: "electricity", constant Aggravating factors: sitting, standing, or laying down for long periods Relieving factors: medication   PRECAUTIONS: None  WEIGHT BEARING RESTRICTIONS No    OBJECTIVE:   TODAY'S TREATMENT                                     EXERCISE LOG  Exercise Repetitions and Resistance Comments  Nustep L2, seat 10 x15 min LEs only  Core press X15 reps 5 sec holds   Hip flexion in standing with core press X8 reps Stopped due to R hip pain increasing with RLE SLS  Shoulder extension Green XTS x20  reps   Heel raises  X20 reps   B shoulder D2 with core X15 reps Green XTS    Blank cell = exercise not performed today   Modalities  Date: 06/19/2022 Unattended Estim: Lumbar, IFC, 10 mins, Pain  PATIENT EDUCATION:  Education details: benefits of water, prognosis, plan of care Person educated: Patient Education method: Explanation Education comprehension: verbalized understanding   HOME EXERCISE PROGRAM:   ASSESSMENT:  CLINICAL IMPRESSION: Patient presented in clinic with reports of continued LBP. Patient able to tolerate some new exercises for core as core activation was instructed. Increased pain reported in R hip during RLE SLS with marching/core. Normal stimulation response noted following removal of the modality.   OBJECTIVE IMPAIRMENTS Abnormal gait, decreased activity tolerance, decreased mobility, difficulty walking, decreased ROM, decreased strength, hypomobility, increased muscle spasms, impaired flexibility, impaired sensation, impaired tone, postural dysfunction, and pain.   ACTIVITY LIMITATIONS carrying, lifting, bending, sitting, standing, squatting, sleeping, transfers, bed mobility, dressing, locomotion level, and caring for others  PARTICIPATION LIMITATIONS: cleaning, driving, shopping, community activity, occupation, and yard work  PERSONAL FACTORS Past/current experiences, Profession, Time since onset of injury/illness/exacerbation, and 1 comorbidity: chronic pain  are also affecting patient's functional outcome.   REHAB POTENTIAL: Fair    CLINICAL DECISION MAKING: Evolving/moderate complexity  EVALUATION COMPLEXITY: Moderate   GOALS: Goals reviewed with patient? Yes  SHORT TERM GOALS: Target date: 07/04/2022  Patient will be independent with her initial HEP.  Baseline: Goal status: INITIAL  2.  Patient will be able to complete her daily activities without her familiar pain exceeding 7/10.  Baseline:  Goal status: INITIAL   LONG TERM GOALS:  Target date: 07/25/2022  Patient will be independent with her advanced HEP.  Baseline:  Goal status: INITIAL  2.  Patient will be able to ambulate with no significant gait deviations.  Baseline:  Goal status: INITIAL  3.  Patient will be able to improve her perform lumbar rotation to at least 50% of normal for improved function with her daily activities.  Baseline:  Goal status: INITIAL  4.  Patient will be able to complete her critical job demands without her familiar back pain exceeding 5/10. Baseline:  Goal status: INITIAL  PLAN: PT FREQUENCY: 2x/week  PT DURATION: 6 weeks  PLANNED INTERVENTIONS: Therapeutic exercises, Therapeutic activity, Neuromuscular re-education, Balance training, Gait training, Patient/Family education, Self Care, Joint mobilization, Electrical stimulation, Spinal mobilization, Cryotherapy, Moist heat, Manual therapy, and Re-evaluation.  PLAN FOR NEXT SESSION: nustep, lumbar stabilization, and modalities as needed   Marvell Fuller, PTA 06/19/2022, 1:45 PM

## 2022-06-22 ENCOUNTER — Ambulatory Visit: Payer: BC Managed Care – PPO | Admitting: Physical Therapy

## 2022-06-22 ENCOUNTER — Encounter: Payer: Self-pay | Admitting: Physical Therapy

## 2022-06-22 DIAGNOSIS — M5416 Radiculopathy, lumbar region: Secondary | ICD-10-CM

## 2022-06-22 NOTE — Therapy (Signed)
OUTPATIENT PHYSICAL THERAPY THORACOLUMBAR TREATMENT   Patient Name: Marissa Coleman MRN: [id number] DOB:[birth year], 56 y.o., female Today's Date: 06/22/2022   PT End of Session - 06/22/22 1304     Visit Number 4    Number of Visits 12    Date for PT Re-Evaluation 07/28/22    PT Start Time 1303    PT Stop Time 1345    PT Time Calculation (min) 42 min    Activity Tolerance Patient limited by pain    Behavior During Therapy [facility] for tasks assessed/performed             History reviewed. No pertinent past medical history. Past Surgical History:  Procedure Laterality Date   BREAST CYST ASPIRATION Right 04/05/2016   There are no problems to display for this patient.  REFERRING PROVIDER: Theda Sers, PA-C  REFERRING DIAG: Postlaminectomy syndrome  Rationale for Evaluation and Treatment Rehabilitation  THERAPY DIAG:  Radiculopathy, lumbar region  ONSET DATE: 11-12 years ago   SUBJECTIVE:                                                                                                                                                                                           SUBJECTIVE STATEMENT: States that pain is about the same. Wearing new shoes today that doesn't feel so good today.  PERTINENT HISTORY:  Multiple lumbar surgeries  PAIN:  Are you having pain? Yes: NPRS scale: 3/10 Pain location: left groin, low back radiating into left leg Pain description: "electricity", constant Aggravating factors: sitting, standing, or laying down for long periods Relieving factors: medication   PRECAUTIONS: None  WEIGHT BEARING RESTRICTIONS No    OBJECTIVE:   TODAY'S TREATMENT                                     EXERCISE LOG  Exercise Repetitions and Resistance Comments  Nustep L2, seat 10 x17 min LEs only  Core press X15 reps 5 sec holds   Hip flexion in standing  X10 reps Min UE support   Shoulder extension Blue XTS x20 reps    Blank cell = exercise not  performed today   Modalities  Date:  Unattended Estim: Lumbar, IFC, 15 mins, Pain Hot Pack: Lumbar, 15 mins, Pain   PATIENT EDUCATION:  Education details: benefits of water, prognosis, plan of care Person educated: Patient Education method: Explanation Education comprehension: verbalized understanding   HOME EXERCISE PROGRAM:   ASSESSMENT:  CLINICAL IMPRESSION: Patient presented in clinic with continued reports of low back pain. Patient reported wearing new  shoes today which may be affecting LBP as well. Patient guided through light standing exercises with core activation. Patient did indicate LBP with all standing exercises today. Normal modalities response noted following removal of the modalities.   OBJECTIVE IMPAIRMENTS Abnormal gait, decreased activity tolerance, decreased mobility, difficulty walking, decreased ROM, decreased strength, hypomobility, increased muscle spasms, impaired flexibility, impaired sensation, impaired tone, postural dysfunction, and pain.   ACTIVITY LIMITATIONS carrying, lifting, bending, sitting, standing, squatting, sleeping, transfers, bed mobility, dressing, locomotion level, and caring for others  PARTICIPATION LIMITATIONS: cleaning, driving, shopping, community activity, occupation, and yard work  PERSONAL FACTORS Past/current experiences, Profession, Time since onset of injury/illness/exacerbation, and 1 comorbidity: chronic pain  are also affecting patient's functional outcome.   REHAB POTENTIAL: Fair    CLINICAL DECISION MAKING: Evolving/moderate complexity  EVALUATION COMPLEXITY: Moderate   GOALS: Goals reviewed with patient? Yes  SHORT TERM GOALS: Target date: 07/04/2022  Patient will be independent with her initial HEP.  Baseline: Goal status: INITIAL  2.  Patient will be able to complete her daily activities without her familiar pain exceeding 7/10.  Baseline:  Goal status: INITIAL   LONG TERM GOALS: Target date:  07/25/2022  Patient will be independent with her advanced HEP.  Baseline:  Goal status: INITIAL  2.  Patient will be able to ambulate with no significant gait deviations.  Baseline:  Goal status: INITIAL  3.  Patient will be able to improve her perform lumbar rotation to at least 50% of normal for improved function with her daily activities.  Baseline:  Goal status: INITIAL  4.  Patient will be able to complete her critical job demands without her familiar back pain exceeding 5/10. Baseline:  Goal status: INITIAL  PLAN: PT FREQUENCY: 2x/week  PT DURATION: 6 weeks  PLANNED INTERVENTIONS: Therapeutic exercises, Therapeutic activity, Neuromuscular re-education, Balance training, Gait training, Patient/Family education, Self Care, Joint mobilization, Electrical stimulation, Spinal mobilization, Cryotherapy, Moist heat, Manual therapy, and Re-evaluation.  PLAN FOR NEXT SESSION: nustep, lumbar stabilization, and modalities as needed   Marvell Fuller, PTA 06/22/2022, 1:49 PM

## 2022-07-11 ENCOUNTER — Ambulatory Visit: Payer: BC Managed Care – PPO

## 2022-07-11 DIAGNOSIS — M5416 Radiculopathy, lumbar region: Secondary | ICD-10-CM | POA: Diagnosis not present

## 2022-07-11 NOTE — Therapy (Signed)
OUTPATIENT PHYSICAL THERAPY THORACOLUMBAR TREATMENT   Patient Name: Marissa Coleman MRN: [id number] DOB:29-Feb-1966, 56 y.o., female Today's Date: 07/11/2022   PT End of Session - 07/11/22 1119     Visit Number 5    Number of Visits 12    Date for PT Re-Evaluation 07/28/22    PT Start Time 1115    PT Stop Time 1200    PT Time Calculation (min) 45 min    Activity Tolerance Patient limited by pain    Behavior During Therapy [facility] for tasks assessed/performed              History reviewed. No pertinent past medical history. Past Surgical History:  Procedure Laterality Date   BREAST CYST ASPIRATION Right 04/05/2016   There are no problems to display for this patient.  REFERRING PROVIDER: Theda Sers, PA-C  REFERRING DIAG: Postlaminectomy syndrome  Rationale for Evaluation and Treatment Rehabilitation  THERAPY DIAG:  Radiculopathy, lumbar region  ONSET DATE: 11-12 years ago   SUBJECTIVE:                                                                                                                                                                                           SUBJECTIVE STATEMENT: Patient reports that she felt "horrible" after her last appointment and she had to take a week off. She notes that her pain has not gotten any better.   PERTINENT HISTORY:  Multiple lumbar surgeries  PAIN:  Are you having pain? Yes: NPRS scale: 4/10 Pain location: left groin, low back radiating into left leg Pain description: "electricity", constant Aggravating factors: sitting, standing, or laying down for long periods Relieving factors: medication   PRECAUTIONS: None  WEIGHT BEARING RESTRICTIONS No    OBJECTIVE:   TODAY'S TREATMENT                                    8/29 EXERCISE LOG  Exercise Repetitions and Resistance Comments  Nustep  L3 x 15 minutes                    Blank cell = exercise not performed today  Manual Therapy Soft Tissue  Mobilization: lumbar paraspinals, for reduced pain with minimal effectiveness  Joint Mobilizations: grade I lumbar, limited by increased low back pain   Modalities: no redness or adverse reaction with today's modalities  Date:  Unattended Estim: Lumbar, IFC @ 80-150 Hz w/ 40% scan, 15 mins, Pain Hot Pack: Lumbar, 15 mins, Pain  8/10 EXERCISE LOG  Exercise Repetitions and Resistance Comments  Nustep L2, seat 10 x17 min LEs only  Core press X15 reps 5 sec holds   Hip flexion in standing  X10 reps Min UE support   Shoulder extension Blue XTS x20 reps    Blank cell = exercise not performed today   Modalities  Date:  Unattended Estim: Lumbar, IFC, 15 mins, Pain Hot Pack: Lumbar, 15 mins, Pain   PATIENT EDUCATION:  Education details: benefits of water, prognosis, plan of care Person educated: Patient Education method: Explanation Education comprehension: verbalized understanding   HOME EXERCISE PROGRAM:   ASSESSMENT:  CLINICAL IMPRESSION: Treatment focused on manual therapy and familiar interventions. However, this did not significantly increase or decrease her familiar symptoms with modalities being the only interventions that were able to slightly reduce her familiar pain. She reported feeling a little better upon the conclusion of treatment. She may benefit from further medical interventions if she is unable to experience significant symptom improvement with physical therapy.    OBJECTIVE IMPAIRMENTS Abnormal gait, decreased activity tolerance, decreased mobility, difficulty walking, decreased ROM, decreased strength, hypomobility, increased muscle spasms, impaired flexibility, impaired sensation, impaired tone, postural dysfunction, and pain.   ACTIVITY LIMITATIONS carrying, lifting, bending, sitting, standing, squatting, sleeping, transfers, bed mobility, dressing, locomotion level, and caring for others  PARTICIPATION LIMITATIONS: cleaning,  driving, shopping, community activity, occupation, and yard work  PERSONAL FACTORS Past/current experiences, Profession, Time since onset of injury/illness/exacerbation, and 1 comorbidity: chronic pain  are also affecting patient's functional outcome.   REHAB POTENTIAL: Fair    CLINICAL DECISION MAKING: Evolving/moderate complexity  EVALUATION COMPLEXITY: Moderate   GOALS: Goals reviewed with patient? Yes  SHORT TERM GOALS: Target date: 07/04/2022  Patient will be independent with her initial HEP.  Baseline: Goal status: INITIAL  2.  Patient will be able to complete her daily activities without her familiar pain exceeding 7/10.  Baseline:  Goal status: INITIAL   LONG TERM GOALS: Target date: 07/25/2022  Patient will be independent with her advanced HEP.  Baseline:  Goal status: INITIAL  2.  Patient will be able to ambulate with no significant gait deviations.  Baseline:  Goal status: INITIAL  3.  Patient will be able to improve her perform lumbar rotation to at least 50% of normal for improved function with her daily activities.  Baseline:  Goal status: INITIAL  4.  Patient will be able to complete her critical job demands without her familiar back pain exceeding 5/10. Baseline:  Goal status: INITIAL  PLAN: PT FREQUENCY: 2x/week  PT DURATION: 6 weeks  PLANNED INTERVENTIONS: Therapeutic exercises, Therapeutic activity, Neuromuscular re-education, Balance training, Gait training, Patient/Family education, Self Care, Joint mobilization, Electrical stimulation, Spinal mobilization, Cryotherapy, Moist heat, Manual therapy, and Re-evaluation.  PLAN FOR NEXT SESSION: nustep, lumbar stabilization, and modalities as needed   Granville Lewis, PT 07/11/2022, 12:18 PM

## 2022-07-13 ENCOUNTER — Ambulatory Visit: Payer: BC Managed Care – PPO

## 2022-07-13 DIAGNOSIS — M5416 Radiculopathy, lumbar region: Secondary | ICD-10-CM | POA: Diagnosis not present

## 2022-07-13 NOTE — Therapy (Addendum)
OUTPATIENT PHYSICAL THERAPY THORACOLUMBAR TREATMENT   Patient Name: Marissa Coleman MRN: [id number] DOB:03-20-1966, 56 y.o., female Today's Date: 07/13/2022   PT End of Session - 07/13/22 1044     Visit Number 6    Number of Visits 12    Date for PT Re-Evaluation 07/28/22    PT Start Time 1038    PT Stop Time 1123    PT Time Calculation (min) 45 min    Activity Tolerance Patient limited by pain    Behavior During Therapy [facility] for tasks assessed/performed              History reviewed. No pertinent past medical history. Past Surgical History:  Procedure Laterality Date   BREAST CYST ASPIRATION Right 04/05/2016   There are no problems to display for this patient.  REFERRING PROVIDER: Theda Sers, PA-C  REFERRING DIAG: Postlaminectomy syndrome  Rationale for Evaluation and Treatment Rehabilitation  THERAPY DIAG:  Radiculopathy, lumbar region  ONSET DATE: 11-12 years ago   SUBJECTIVE:                                                                                                                                                                                           SUBJECTIVE STATEMENT: Patient reports that she feels much better than she did on Tuesday.  PERTINENT HISTORY:  Multiple lumbar surgeries  PAIN:  Are you having pain? Yes: NPRS scale: 3/10 Pain location: left groin, low back radiating into left leg Pain description: "electricity", constant Aggravating factors: sitting, standing, or laying down for long periods Relieving factors: medication   PRECAUTIONS: None  WEIGHT BEARING RESTRICTIONS No    OBJECTIVE:   TODAY'S TREATMENT                                    8/31 EXERCISE LOG  Exercise Repetitions and Resistance Comments  Bike L3 x 15 minutes                    Blank cell = exercise not performed today  Manual Therapy Soft Tissue Mobilization: lumbar paraspinals, STW/M to lumbar paraspinals to decrease pain and tone     Modalities: no redness or adverse reaction with today's modalities  Date:  Unattended Estim: Lumbar, IFC @ 80-150 Hz w/ 40% scan, 15 mins, Pain Hot Pack: Lumbar, 15 mins, Pain   PATIENT EDUCATION:  Education details: benefits of water, prognosis, plan of care Person educated: Patient Education method: Explanation Education comprehension: verbalized understanding   HOME EXERCISE PROGRAM:   ASSESSMENT:  CLINICAL IMPRESSION: Pt arrives  for today's treatment session 7 mins late reporting 3/10 low back pain.  Pt able to tolerate transition to recumbent bike for warm up vs NuStep without issue or complaint.  STW/M performed to bil lumbar paraspinals to decrease pain and tone with good result.  Normal responses to estim and MH noted upon removal.  Pt reported 2/10 low back pain upon completion of today's treatment session.    PHYSICAL THERAPY DISCHARGE SUMMARY  Visits from Start of Care: 6  Current functional level related to goals / functional outcomes: Patient was unable to meet her goals for skilled physical therapy.    Remaining deficits: Pain    Education / Equipment: HEP    Patient agrees to discharge. Patient goals were not met. Patient is being discharged due to not returning since the last visit.  Candi Leash, PT, DPT    OBJECTIVE IMPAIRMENTS Abnormal gait, decreased activity tolerance, decreased mobility, difficulty walking, decreased ROM, decreased strength, hypomobility, increased muscle spasms, impaired flexibility, impaired sensation, impaired tone, postural dysfunction, and pain.   ACTIVITY LIMITATIONS carrying, lifting, bending, sitting, standing, squatting, sleeping, transfers, bed mobility, dressing, locomotion level, and caring for others  PARTICIPATION LIMITATIONS: cleaning, driving, shopping, community activity, occupation, and yard work  PERSONAL FACTORS Past/current experiences, Profession, Time since onset of injury/illness/exacerbation, and 1  comorbidity: chronic pain  are also affecting patient's functional outcome.   REHAB POTENTIAL: Fair    CLINICAL DECISION MAKING: Evolving/moderate complexity  EVALUATION COMPLEXITY: Moderate   GOALS: Goals reviewed with patient? Yes  SHORT TERM GOALS: Target date: 07/04/2022  Patient will be independent with her initial HEP.  Baseline: Goal status: INITIAL  2.  Patient will be able to complete her daily activities without her familiar pain exceeding 7/10.  Baseline:  Goal status: INITIAL   LONG TERM GOALS: Target date: 07/25/2022  Patient will be independent with her advanced HEP.  Baseline:  Goal status: INITIAL  2.  Patient will be able to ambulate with no significant gait deviations.  Baseline:  Goal status: INITIAL  3.  Patient will be able to improve her perform lumbar rotation to at least 50% of normal for improved function with her daily activities.  Baseline:  Goal status: INITIAL  4.  Patient will be able to complete her critical job demands without her familiar back pain exceeding 5/10. Baseline:  Goal status: INITIAL  PLAN: PT FREQUENCY: 2x/week  PT DURATION: 6 weeks  PLANNED INTERVENTIONS: Therapeutic exercises, Therapeutic activity, Neuromuscular re-education, Balance training, Gait training, Patient/Family education, Self Care, Joint mobilization, Electrical stimulation, Spinal mobilization, Cryotherapy, Moist heat, Manual therapy, and Re-evaluation.  PLAN FOR NEXT SESSION: nustep, lumbar stabilization, and modalities as needed   Newman Pies, PTA 07/13/2022, 11:24 AM

## 2022-07-20 DIAGNOSIS — M25552 Pain in left hip: Secondary | ICD-10-CM | POA: Diagnosis not present

## 2022-07-20 DIAGNOSIS — M47816 Spondylosis without myelopathy or radiculopathy, lumbar region: Secondary | ICD-10-CM | POA: Diagnosis not present

## 2022-07-20 DIAGNOSIS — M4326 Fusion of spine, lumbar region: Secondary | ICD-10-CM | POA: Diagnosis not present

## 2022-07-20 DIAGNOSIS — M961 Postlaminectomy syndrome, not elsewhere classified: Secondary | ICD-10-CM | POA: Diagnosis not present

## 2022-07-28 DIAGNOSIS — U071 COVID-19: Secondary | ICD-10-CM | POA: Diagnosis not present

## 2022-07-28 DIAGNOSIS — R1110 Vomiting, unspecified: Secondary | ICD-10-CM | POA: Diagnosis not present

## 2022-07-28 DIAGNOSIS — R42 Dizziness and giddiness: Secondary | ICD-10-CM | POA: Diagnosis not present

## 2022-07-28 DIAGNOSIS — R070 Pain in throat: Secondary | ICD-10-CM | POA: Diagnosis not present

## 2022-08-10 DIAGNOSIS — M25552 Pain in left hip: Secondary | ICD-10-CM | POA: Diagnosis not present

## 2022-08-10 DIAGNOSIS — M4326 Fusion of spine, lumbar region: Secondary | ICD-10-CM | POA: Diagnosis not present

## 2022-08-10 DIAGNOSIS — M47816 Spondylosis without myelopathy or radiculopathy, lumbar region: Secondary | ICD-10-CM | POA: Diagnosis not present

## 2022-08-10 DIAGNOSIS — M961 Postlaminectomy syndrome, not elsewhere classified: Secondary | ICD-10-CM | POA: Diagnosis not present

## 2022-08-16 ENCOUNTER — Ambulatory Visit (INDEPENDENT_AMBULATORY_CARE_PROVIDER_SITE_OTHER): Payer: BC Managed Care – PPO | Admitting: Family Medicine

## 2022-08-16 ENCOUNTER — Encounter: Payer: Self-pay | Admitting: Family Medicine

## 2022-08-16 VITALS — BP 120/84 | HR 82 | Ht 66.0 in | Wt 194.0 lb

## 2022-08-16 DIAGNOSIS — R7301 Impaired fasting glucose: Secondary | ICD-10-CM

## 2022-08-16 DIAGNOSIS — Z23 Encounter for immunization: Secondary | ICD-10-CM

## 2022-08-16 DIAGNOSIS — E559 Vitamin D deficiency, unspecified: Secondary | ICD-10-CM

## 2022-08-16 DIAGNOSIS — M545 Low back pain, unspecified: Secondary | ICD-10-CM | POA: Diagnosis not present

## 2022-08-16 DIAGNOSIS — Z114 Encounter for screening for human immunodeficiency virus [HIV]: Secondary | ICD-10-CM | POA: Diagnosis not present

## 2022-08-16 DIAGNOSIS — E038 Other specified hypothyroidism: Secondary | ICD-10-CM

## 2022-08-16 DIAGNOSIS — Z1159 Encounter for screening for other viral diseases: Secondary | ICD-10-CM

## 2022-08-16 DIAGNOSIS — M79605 Pain in left leg: Secondary | ICD-10-CM

## 2022-08-16 NOTE — Progress Notes (Signed)
New Patient Office Visit  Subjective:  Patient ID: Marissa Coleman, female    DOB: Aug 19, 1966  Age: 56 y.o. MRN: [id number]  CC:  Chief Complaint  Patient presents with   New Patient (Initial Visit)    Establishing care, transferring care from Dr. Mannie Stabile, would like a meet and greet just a general check up. Due for pap smear will come back for appt    HPI Marissa Coleman is a 56 y.o. female with past medical history of left lumbar pain with left-sided sciatica presents for establishing care.  Left lumbar pain with left-sided sciatica: She sees Dr. Zonia Kief, a spine specialist in [place].  She reports having 2 back surgeries,  the first in 2011 and the second in 2021.  She reports minimal relief of her back pain with the surgeries.  She reports minimal relief of her symptoms with physical therapy, chiropractor, gabapentin, and steroid injections. She reports that her spine specialist is now working on a back stimulator for her back pain; however, her insurance declined the stimulator.  She is scheduled for a nerve conduction study and will be following up with Dr. Zonia Kief.  She reports the pain starts in her left lower back and radiates down her left lower left leg.  She also feels pain from her right knee to her right groin.  She wears a back brace daily because bending and twisting aggravates the pain.  She received her Tdap and first dose of her shingles vaccine today.  History reviewed. No pertinent past medical history.  Past Surgical History:  Procedure Laterality Date   BREAST CYST ASPIRATION Right 04/05/2016    Family History  Problem Relation Age of Onset   Breast cancer Mother 80   Breast cancer Maternal Aunt 55    Social History   Socioeconomic History   Marital status: Divorced    Spouse name: Not on file   Number of children: Not on file   Years of education: Not on file   Highest education level: Not on file  Occupational History   Not on file  Tobacco  Use   Smoking status: Never   Smokeless tobacco: Not on file  Substance and Sexual Activity   Alcohol use: Not on file   Drug use: Not on file   Sexual activity: Not on file  Other Topics Concern   Not on file  Social History Narrative   Not on file   Social Determinants of Health   Financial Resource Strain: Not on file  Food Insecurity: Not on file  Transportation Needs: Not on file  Physical Activity: Not on file  Stress: Not on file  Social Connections: Not on file  Intimate Partner Violence: Not on file    ROS Review of Systems  Constitutional:  Negative for fatigue and fever.  HENT:  Negative for sinus pressure and sinus pain.   Eyes:  Negative for photophobia and redness.  Respiratory:  Negative for chest tightness and shortness of breath.   Cardiovascular:  Negative for chest pain and palpitations.  Gastrointestinal:  Negative for nausea and vomiting.  Endocrine: Negative for polydipsia and polyphagia.  Genitourinary:  Negative for decreased urine volume and hematuria.  Musculoskeletal:  Positive for back pain.  Skin:  Negative for rash and wound.  Neurological:  Negative for dizziness and headaches.  Psychiatric/Behavioral:  Negative for suicidal ideas. The patient is not nervous/anxious.     Objective:   Today's Vitals: BP 120/84   Pulse 82  Ht 5' 6" (1.676 m)   Wt 194 lb 0.6 oz (88 kg)   SpO2 96%   BMI 31.32 kg/m   Physical Exam HENT:     Head: Normocephalic.     Right Ear: External ear normal.     Nose: No congestion.     Mouth/Throat:     Mouth: Mucous membranes are moist.  Cardiovascular:     Rate and Rhythm: Normal rate and regular rhythm.     Pulses: Normal pulses.     Heart sounds: Normal heart sounds.  Pulmonary:     Effort: Pulmonary effort is normal.     Breath sounds: Normal breath sounds.  Abdominal:     Palpations: Abdomen is soft.     Tenderness: There is no right CVA tenderness or left CVA tenderness.  Musculoskeletal:      Cervical back: No rigidity.     Right lower leg: No edema.     Left lower leg: No edema.     Comments: Tenderness with palpation at the left lumbar musculature.  Skin:    Findings: No lesion or rash.  Neurological:     Mental Status: She is alert.  Psychiatric:     Comments: Normal affect     Assessment & Plan:   Problem List Items Addressed This Visit       Other   Lumbar pain with radiation down left leg - Primary    Encouraged to follow up with Dr. Zonia Kief, a spine specialist in [place] Encouraged to take Flexeril 10 mg as needed for back pain We will get labs today since there are no current labs on file for her      Relevant Medications   diclofenac (VOLTAREN) 75 MG EC tablet   Other Visit Diagnoses     Immunization due       Relevant Orders   Tdap vaccine greater than or equal to 7yo IM (Completed)   Varicella-zoster vaccine IM (Completed)   Encounter for screening for HIV       Relevant Orders   HIV antibody (with reflex)   Need for hepatitis C screening test       Relevant Orders   Hepatitis C Antibody   Vitamin D deficiency       Relevant Orders   Vitamin D (25 hydroxy)   Other specified hypothyroidism       Relevant Orders   TSH + free T4   IFG (impaired fasting glucose)       Relevant Orders   CBC with Differential/Platelet   CMP14+EGFR   Hemoglobin A1C   Lipid Profile       Outpatient Encounter Medications as of 08/16/2022  Medication Sig   amitriptyline (ELAVIL) 10 MG tablet Take by mouth at bedtime.   Cholecalciferol (VITAMIN D3) 3000 UNITS TABS Take by mouth.   diclofenac (VOLTAREN) 75 MG EC tablet    EPINEPHrine 0.3 mg/0.3 mL IJ SOAJ injection See admin instructions.   Magnesium Oxide 200 MG TABS See admin instructions.   [DISCONTINUED] metoprolol succinate (TOPROL-XL) 25 MG 24 hr tablet Take 1 tablet (25 mg total) by mouth daily.   cyclobenzaprine (FLEXERIL) 10 MG tablet 1 tablet at bedtime as needed (Patient not taking:  Reported on 08/16/2022)   Riboflavin 400 MG CAPS 1 capsule (Patient not taking: Reported on 08/16/2022)   [DISCONTINUED] amLODipine (NORVASC) 10 MG tablet 1 tablet (Patient not taking: Reported on 08/16/2022)   No facility-administered encounter medications on file as of 08/16/2022.  Follow-up: Return in about 1 month (around 09/16/2022) for Pap smear.   Alvira Monday, FNP

## 2022-08-16 NOTE — Patient Instructions (Signed)
I appreciate the opportunity to provide care to you today!    Follow up:  1 months for pap smear  Fasting labs: please stop by the lab during the week to get your blood drawn (CBC, CMP, TSH, Lipid profile, HgA1c, Vit D)  Screening: HIV and Hep C    Please continue to a heart-healthy diet and increase your physical activities. Try to exercise for 50mins at least three times a week.      It was a pleasure to see you and I look forward to continuing to work together on your health and well-being. Please do not hesitate to call the office if you need care or have questions about your care.   Have a wonderful day and week. With Gratitude, Alvira Monday MSN, FNP-BC

## 2022-08-16 NOTE — Assessment & Plan Note (Addendum)
Encouraged to follow up with Dr. Zonia Kief, a spine specialist in [place] Encouraged to take Flexeril 10 mg as needed for back pain We will get labs today since there are no current labs on file for her

## 2022-08-28 DIAGNOSIS — M5416 Radiculopathy, lumbar region: Secondary | ICD-10-CM | POA: Insufficient documentation

## 2022-08-28 DIAGNOSIS — M79662 Pain in left lower leg: Secondary | ICD-10-CM | POA: Diagnosis not present

## 2022-08-28 DIAGNOSIS — M4726 Other spondylosis with radiculopathy, lumbar region: Secondary | ICD-10-CM | POA: Diagnosis not present

## 2022-08-28 DIAGNOSIS — M4807 Spinal stenosis, lumbosacral region: Secondary | ICD-10-CM | POA: Diagnosis not present

## 2022-08-28 DIAGNOSIS — R202 Paresthesia of skin: Secondary | ICD-10-CM | POA: Diagnosis not present

## 2022-08-28 DIAGNOSIS — M545 Low back pain, unspecified: Secondary | ICD-10-CM | POA: Diagnosis not present

## 2022-09-15 ENCOUNTER — Telehealth: Payer: Self-pay | Admitting: Family Medicine

## 2022-09-18 ENCOUNTER — Ambulatory Visit: Payer: BC Managed Care – PPO | Admitting: Family Medicine

## 2022-09-22 NOTE — Telephone Encounter (Signed)
error 

## 2022-09-27 ENCOUNTER — Ambulatory Visit (INDEPENDENT_AMBULATORY_CARE_PROVIDER_SITE_OTHER): Payer: BC Managed Care – PPO | Admitting: Family Medicine

## 2022-09-27 ENCOUNTER — Encounter: Payer: Self-pay | Admitting: Family Medicine

## 2022-09-27 ENCOUNTER — Other Ambulatory Visit (HOSPITAL_COMMUNITY)
Admission: RE | Admit: 2022-09-27 | Discharge: 2022-09-27 | Disposition: A | Discharge status: Home / Self Care | Payer: BC Managed Care – PPO | Source: Ambulatory Visit | Attending: Family Medicine | Admitting: Family Medicine

## 2022-09-27 VITALS — BP 114/68 | HR 71 | Ht 66.0 in | Wt 189.1 lb

## 2022-09-27 DIAGNOSIS — Z124 Encounter for screening for malignant neoplasm of cervix: Secondary | ICD-10-CM | POA: Diagnosis not present

## 2022-09-27 DIAGNOSIS — Z0001 Encounter for general adult medical examination with abnormal findings: Secondary | ICD-10-CM | POA: Diagnosis not present

## 2022-09-27 DIAGNOSIS — Z23 Encounter for immunization: Secondary | ICD-10-CM

## 2022-09-27 DIAGNOSIS — Z1231 Encounter for screening mammogram for malignant neoplasm of breast: Secondary | ICD-10-CM

## 2022-09-27 NOTE — Patient Instructions (Addendum)
I appreciate the opportunity to provide care to you today!    Follow up:  4 months  I will contact you on MyChart with the results of your Pap examination today  Please continue to follow-up with orthopedics as scheduled   Please continue to a heart-healthy diet and increase your physical activities. Try to exercise for at least three times a week.      It was a pleasure to see you and I look forward to continuing to work together on your health and well-being. Please do not hesitate to call the office if you need care or have questions about your care.   Have a wonderful day and week. With Gratitude, Gilmore Laroche MSN, FNP-BC

## 2022-09-27 NOTE — Assessment & Plan Note (Signed)
-   According to the [employer], cytology and HPV co-testing (preferred) every 5 years or cytology alone (acceptable) every 3 years. - Pap due 2026  

## 2022-09-27 NOTE — Assessment & Plan Note (Signed)
Patient educated on CDC recommendation for the vaccine. Verbal consent was obtained from the patient, vaccine administered by nurse, no sign of adverse reactions noted at this time. Patient education on arm soreness and use of tylenol or ibuprofen for this patient  was discussed. Patient educated on the signs and symptoms of adverse effect and advise to contact the office if they occur.  

## 2022-09-27 NOTE — Assessment & Plan Note (Signed)
Physical exam as documented Counseling is done on healthy lifestyle involving commitment to 150 minutes of exercise per week,  Discussed heart-healthy diet and attaining a healthy weight Immunization and cancer screening needs are specifically addressed at this visit     

## 2022-09-27 NOTE — Progress Notes (Signed)
Complete physical exam  Patient: Marissa Coleman   DOB: [birth year]   56 y.o. Female  MRN: [id number]  Subjective:    Chief Complaint  Patient presents with   Annual Exam    Annual exam today, reports foot pain since 07/28/2022,  and right knee pain onset of this has been going on for a while just having a flare up   Gynecologic Exam    Pap today.     Marissa Coleman is a 56 y.o. female who presents today for a complete physical exam. She reports consuming a general diet. The patient has a physically strenuous job, but has no regular exercise apart from work.  She generally feels poorly due to her chronic lumbar pain. She reports sleeping poorly due to chronic lumbar pain. She does not have additional problems to discuss today.       Most recent fall risk assessment:    08/16/2022    3:08 PM  Severn in the past year? 0  Number falls in past yr: 0  Injury with Fall? 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 No pertinent past medical history. Past Surgical History:  Procedure Laterality Date   BREAST CYST ASPIRATION Right 04/05/2016   Social History   Tobacco Use   Smoking status: Never   Social History   Socioeconomic History   Marital status: Divorced    Spouse name: Not on file   Number of children: Not on file   Years of education: Not on file   Highest education level: Not on file  Occupational History   Not  on file  Tobacco Use   Smoking status: Never   Smokeless tobacco: Not on file  Substance and Sexual Activity   Alcohol use: Not on file   Drug use: Not on file   Sexual activity: Not on file  Other Topics Concern   Not on file  Social History Narrative   Not on file   Social Determinants of Health   Financial Resource Strain: Not on file  Food Insecurity: Not on file  Transportation Needs: Not on file  Physical Activity: Not on file  Stress: Not on file  Social Connections: Not on file  Intimate Partner Violence: Not on file   Family Status  Relation Name Status   Mother  Alive   Father  Alive   [place]  (Not Specified)   Family History  Problem Relation Age of Onset   Breast cancer Mother 35   Breast cancer Maternal Aunt 45   Allergies  Allergen Reactions   Morphine Nausea And Vomiting   Penicillins    Shellfish Allergy       Patient Care Team: Alvira Monday, FNP as PCP - General (Family Medicine)   Outpatient Medications Prior to Visit  Medication Sig   amitriptyline (ELAVIL) 10  MG tablet Take by mouth at bedtime.   Cholecalciferol (VITAMIN D3) 3000 UNITS TABS Take by mouth.   diclofenac (VOLTAREN) 75 MG EC tablet    EPINEPHrine 0.3 mg/0.3 mL IJ SOAJ injection See admin instructions.   Magnesium Oxide 200 MG TABS See admin instructions.   cyclobenzaprine (FLEXERIL) 10 MG tablet    Riboflavin 400 MG CAPS 1 capsule (Patient not taking: Reported on 08/16/2022)   No facility-administered medications prior to visit.    Review of Systems  Constitutional:  Negative for fever and malaise/fatigue.  Respiratory:  Negative for cough and shortness of breath.   Cardiovascular:  Negative for chest pain and palpitations.  Gastrointestinal:  Negative for nausea and vomiting.  Genitourinary:  Negative for frequency and urgency.  Musculoskeletal:  Positive for back pain. Negative for falls.  Skin:  Negative for itching and rash.  Neurological:  Negative for  dizziness and headaches.  Endo/Heme/Allergies:  Negative for polydipsia.  Psychiatric/Behavioral:  Negative for substance abuse and suicidal ideas.           Objective:     BP 114/68   Pulse 71   Ht _0  (1.676 m)   Wt 189 lb 1.9 oz (85.8 kg)   LMP 04/29/2017 (Exact Date)   SpO2 99%   BMI 30.52 kg/m  BP Readings from Last 3 Encounters:  09/27/22 114/68  08/16/22 120/84  04/26/21 124/72   Wt Readings from Last 3 Encounters:  09/27/22 189 lb 1.9 oz (85.8 kg)  08/16/22 194 lb 0.6 oz (88 kg)  04/26/21 213 lb 3.2 oz (96.7 kg)      Physical Exam HENT:     Head: Normocephalic.     Right Ear: External ear normal.     Left Ear: External ear normal.     Nose: No congestion.     Mouth/Throat:     Mouth: Mucous membranes are moist.  Eyes:     Extraocular Movements: Extraocular movements intact.     Pupils: Pupils are equal, round, and reactive to light.  Cardiovascular:     Rate and Rhythm: Normal rate and regular rhythm.     Pulses: Normal pulses.     Heart sounds: Normal heart sounds. No murmur heard. Pulmonary:     Effort: Pulmonary effort is normal.     Breath sounds: Normal breath sounds. No wheezing.  Abdominal:     Palpations: Abdomen is soft.     Tenderness: There is no right CVA tenderness or left CVA tenderness.  Genitourinary:    General: Normal vulva.     Exam position: Lithotomy position.     Tanner stage (genital): 5.     Labia:        Right: No rash, tenderness, lesion or injury.        Left: No rash, tenderness, lesion or injury.      Comments: Vaginal wall: pink and rugated, smooth and non-tender; absence of lesions, edema, and erythema. Labia Majora and Minora: present bilaterally, moist, soft tissue, and homogeneous; free of edema and ulcerations. Clitoris is anatomically present, above the urethral, and free of lesions, masses, and ulceration. Musculoskeletal:     Cervical back: No rigidity.     Right lower leg: No edema.     Left lower leg: No  edema.     Comments: Tenderness with palpation of the lumbar musculature Negative CVA tenderness Mild swelling of the right knee with no signs of inflammation   Skin:    Findings: No bruising or erythema.  Neurological:     Mental Status: She is alert and oriented to person, place, and time.     Cranial Nerves: No cranial nerve deficit.     Sensory: Sensation is intact.     Motor: No atrophy.     Coordination: Finger-Nose-Finger Test normal.     Gait: Gait normal.      No results found for any visits on 09/27/22. Last CBC No results found for: "WBC", "HGB", "HCT", "MCV", "MCH", "RDW", "PLT" Last metabolic panel No results found for: "GLUCOSE", "NA", "K", "CL", "CO2", "BUN", "CREATININE", "EGFR", "CALCIUM", "PHOS", "PROT", "ALBUMIN", "LABGLOB", "AGRATIO", "BILITOT", "ALKPHOS", "AST", "ALT", "ANIONGAP" Last lipids No results found for: "CHOL", "HDL", "LDLCALC", "LDLDIRECT", "TRIG", "CHOLHDL" Last hemoglobin A1c No results found for: "HGBA1C" Last thyroid functions No results found for: "TSH", "T3TOTAL", "T4TOTAL", "THYROIDAB" Last vitamin D No results found for: "25OHVITD2", "25OHVITD3", "VD25OH" Last vitamin B12 and Folate No results found for: "VITAMINB12", "FOLATE"      Assessment & Plan:    Routine Health Maintenance and Physical Exam  Immunization History  Administered Date(s) Administered   Influenza,inj,Quad PF,6+ Mos 09/27/2022   Tdap 08/16/2022   Zoster Recombinat (Shingrix) 08/16/2022    Health Maintenance  Topic Date Due   Hepatitis C Screening  Never done   PAP SMEAR-Modifier  Never done   COVID-19 Vaccine (4 - Pfizer series) 12/13/2020   Zoster Vaccines- Shingrix (2 of 2) 10/11/2022   MAMMOGRAM  04/27/2023   COLONOSCOPY (Pts 45-31yr Insurance coverage will need to be confirmed)  08/26/2029   TETANUS/TDAP  08/16/2032   INFLUENZA VACCINE  Completed   HIV Screening  Completed   HPV VACCINES  Aged Out    Discussed health benefits of physical  activity, and encouraged her to engage in regular exercise appropriate for her age and condition.  Problem List Items Addressed This Visit       Other   Encounter for general adult medical examination with abnormal findings - Primary    Physical exam as documented Counseling is done on healthy lifestyle involving commitment to 150 minutes of exercise per week,  Discussed heart-healthy diet and attaining a healthy weight Immunization and cancer screening needs are specifically addressed at this visit          Need for immunization against influenza    Patient educated on CDC recommendation for the vaccine. Verbal consent was obtained from the patient, vaccine administered by nurse, no sign of adverse reactions noted at this time. Patient education on arm soreness and use of tylenol or ibuprofen for this patient  was discussed. Patient educated on the signs and symptoms of adverse effect and advise to contact the office if they occur.       Pap smear for cervical cancer screening    - According to the [facility] cytology and HPV co-testing (preferred) every 5 years or cytology alone (acceptable) every 3 years. - Pap due 2026       Other Visit Diagnoses     Flu vaccine need       Relevant Orders   Flu Vaccine QUAD 6+ mos PF IM (Fluarix Quad PF) (Completed)   Cervical cancer screening       Relevant Orders   Cytology - PAP   Breast cancer screening by mammogram       Relevant Orders   MM 3D SCREEN BREAST BILATERAL      Return in about 4 months (around 01/26/2023).     GAlvira Monday FNP

## 2022-09-28 LAB — CYTOLOGY - PAP
Comment: NEGATIVE
Diagnosis: NEGATIVE
High risk HPV: NEGATIVE

## 2022-09-28 NOTE — Progress Notes (Signed)
Please inform the patient that her pap smear was negative for abnormal growth or malignancy on her cervix.

## 2022-10-13 ENCOUNTER — Ambulatory Visit (HOSPITAL_COMMUNITY)
Admission: RE | Admit: 2022-10-13 | Discharge: 2022-10-13 | Disposition: A | Discharge status: Home / Self Care | Payer: BC Managed Care – PPO | Source: Ambulatory Visit | Attending: Family Medicine | Admitting: Family Medicine

## 2022-10-13 DIAGNOSIS — Z1231 Encounter for screening mammogram for malignant neoplasm of breast: Secondary | ICD-10-CM | POA: Insufficient documentation

## 2022-10-16 ENCOUNTER — Other Ambulatory Visit (HOSPITAL_COMMUNITY): Payer: Self-pay | Admitting: Family Medicine

## 2022-10-16 DIAGNOSIS — R928 Other abnormal and inconclusive findings on diagnostic imaging of breast: Secondary | ICD-10-CM

## 2022-10-24 ENCOUNTER — Ambulatory Visit (HOSPITAL_COMMUNITY)
Admission: RE | Admit: 2022-10-24 | Discharge: 2022-10-24 | Disposition: A | Discharge status: Home / Self Care | Payer: BC Managed Care – PPO | Source: Ambulatory Visit | Attending: Family Medicine | Admitting: Family Medicine

## 2022-10-24 ENCOUNTER — Encounter (HOSPITAL_COMMUNITY): Payer: Self-pay

## 2022-10-24 DIAGNOSIS — R928 Other abnormal and inconclusive findings on diagnostic imaging of breast: Secondary | ICD-10-CM | POA: Insufficient documentation

## 2022-10-24 DIAGNOSIS — R922 Inconclusive mammogram: Secondary | ICD-10-CM | POA: Diagnosis not present

## 2022-11-09 ENCOUNTER — Ambulatory Visit: Payer: BC Managed Care – PPO | Admitting: Family Medicine

## 2022-11-14 ENCOUNTER — Ambulatory Visit (INDEPENDENT_AMBULATORY_CARE_PROVIDER_SITE_OTHER): Payer: BC Managed Care – PPO | Admitting: Internal Medicine

## 2022-11-14 ENCOUNTER — Encounter: Payer: Self-pay | Admitting: Internal Medicine

## 2022-11-14 VITALS — BP 148/85 | HR 68 | Ht 66.0 in | Wt 190.1 lb

## 2022-11-14 DIAGNOSIS — R3129 Other microscopic hematuria: Secondary | ICD-10-CM

## 2022-11-14 DIAGNOSIS — R3121 Asymptomatic microscopic hematuria: Secondary | ICD-10-CM

## 2022-11-14 NOTE — Progress Notes (Signed)
     HPI:[name] is a 57 y.o. female who presents for evaluation of microscopic hematuria. For the details of today's visit, please refer to the assessment and plan.   Physical Exam: Vitals:   11/14/22 0948  BP: (!) 148/85  Pulse: 68  SpO2: 95%  Weight: 190 lb 1.3 oz (86.2 kg)  Height: 5\' 6"  (1.676 m)     Physical Exam Constitutional:      Appearance: She is well-developed and well-groomed.  Eyes:     General: No scleral icterus.    Conjunctiva/sclera: Conjunctivae normal.  Cardiovascular:     Rate and Rhythm: Normal rate and regular rhythm.     Heart sounds: No murmur heard.    No friction rub. No gallop.  Pulmonary:     Effort: Pulmonary effort is normal.     Breath sounds: No wheezing, rhonchi or rales.  Abdominal:     Palpations: Abdomen is soft.     Tenderness: There is no abdominal tenderness.      Assessment & Plan:   Hematuria, microscopic Patient ask for further evaluation of possible urinary tract infection. She was told she had hematuria on recent urine study. She has some increased frequency last week , but this has resolved. She has chronic occasional urinary incontince. Mostly at night. History of 5 vaginal deliveries. She is asymptomatic today. She was seen by urology approximately 20 years ago and underwent cystoscopy. They found blisters, but she has not had to follow with them since.   Assessment/Plan: Acute problem. We will repeat UA today to confirm microscopic hematuria. Check BMP and CBC. Further workup depending on results of these labs.     Marissa Dy, MD

## 2022-11-14 NOTE — Patient Instructions (Addendum)
Thank you, Ms.Roderick Sweezy for allowing [place] to provide your care today. Today we discussed hematuria found on recent urine study and urinary incontinence.   I have ordered the following labs for you:   Lab Orders         Urinalysis         Basic Metabolic Panel (BMET)         CBC with Differential/Platelet      Tests ordered today:  none  Referrals ordered today:   Referral Orders  No referral(s) requested today     I have ordered the following medication/changed the following medications:   Stop the following medications: There are no discontinued medications.   Start the following medications: No orders of the defined types were placed in this encounter.    Follow up:  As needed        Remember:  I will follow up with results and recommendations.      Tamsen Snider, M.D.

## 2022-11-15 LAB — BASIC METABOLIC PANEL
BUN/Creatinine Ratio: 17 (ref 9–23)
BUN: 15 mg/dL (ref 6–24)
CO2: 25 mmol/L (ref 20–29)
Calcium: 9.3 mg/dL (ref 8.7–10.2)
Chloride: 103 mmol/L (ref 96–106)
Creatinine, Ser: 0.9 mg/dL (ref 0.57–1.00)
Glucose: 68 mg/dL — ABNORMAL LOW (ref 70–99)
Potassium: 3.8 mmol/L (ref 3.5–5.2)
Sodium: 141 mmol/L (ref 134–144)
eGFR: 76 mL/min/{1.73_m2} (ref >59)

## 2022-11-15 LAB — CBC WITH DIFFERENTIAL/PLATELET
Basophils Absolute: 0 10*3/uL (ref 0.0–0.2)
Basos: 1 %
EOS (ABSOLUTE): 0.1 10*3/uL (ref 0.0–0.4)
Eos: 2 %
Hematocrit: 37.1 % (ref 34.0–46.6)
Hemoglobin: 12.8 g/dL (ref 11.1–15.9)
Immature Grans (Abs): 0 10*3/uL (ref 0.0–0.1)
Immature Granulocytes: 0 %
Lymphocytes Absolute: 0.9 10*3/uL (ref 0.7–3.1)
Lymphs: 23 %
MCH: 32.4 pg (ref 26.6–33.0)
MCHC: 34.5 g/dL (ref 31.5–35.7)
MCV: 94 fL (ref 79–97)
Monocytes Absolute: 0.4 10*3/uL (ref 0.1–0.9)
Monocytes: 9 %
Neutrophils Absolute: 2.5 10*3/uL (ref 1.4–7.0)
Neutrophils: 65 %
Platelets: 227 10*3/uL (ref 150–450)
RBC: 3.95 x10E6/uL (ref 3.77–5.28)
RDW: 12.5 % (ref 11.7–15.4)
WBC: 4 10*3/uL (ref 3.4–10.8)

## 2022-11-15 LAB — URINALYSIS
Bilirubin, UA: NEGATIVE
Glucose, UA: NEGATIVE
Ketones, UA: NEGATIVE
Leukocytes,UA: NEGATIVE
Nitrite, UA: NEGATIVE
Specific Gravity, UA: 1.021 (ref 1.005–1.030)
Urobilinogen, Ur: 0.2 mg/dL (ref 0.2–1.0)
pH, UA: 5.5 (ref 5.0–7.5)

## 2022-11-16 DIAGNOSIS — R3129 Other microscopic hematuria: Secondary | ICD-10-CM | POA: Insufficient documentation

## 2022-11-16 NOTE — Assessment & Plan Note (Addendum)
Patient ask for further evaluation of possible urinary tract infection. She was told she had hematuria on recent urine study. She has some increased frequency last week , but this has resolved. She has chronic occasional urinary incontince. Mostly at night. History of 5 vaginal deliveries. She is asymptomatic today. She was seen by urology approximately 20 years ago and underwent cystoscopy. They found blisters, but she has not had to follow with them since.   Assessment/Plan: Undiagnosed new problem with uncertain prognosis  We will repeat UA today to confirm microscopic hematuria. Check BMP and CBC. Further workup depending on results of these labs.

## 2022-11-17 NOTE — Progress Notes (Signed)
I called and discussed microscopic hematuria on lab work. No sign of infection at this time. Patient does not remember the urologist she saw many years ago. We made a shared decision to repeat urinalysis in 6 weeks. She will call to make appointment.

## 2022-12-06 ENCOUNTER — Other Ambulatory Visit: Payer: Self-pay | Admitting: Family Medicine

## 2022-12-07 ENCOUNTER — Ambulatory Visit (INDEPENDENT_AMBULATORY_CARE_PROVIDER_SITE_OTHER): Payer: BC Managed Care – PPO | Admitting: Podiatry

## 2022-12-07 ENCOUNTER — Ambulatory Visit (INDEPENDENT_AMBULATORY_CARE_PROVIDER_SITE_OTHER): Payer: BC Managed Care – PPO

## 2022-12-07 ENCOUNTER — Encounter: Payer: Self-pay | Admitting: Podiatry

## 2022-12-07 DIAGNOSIS — M5416 Radiculopathy, lumbar region: Secondary | ICD-10-CM

## 2022-12-07 DIAGNOSIS — M778 Other enthesopathies, not elsewhere classified: Secondary | ICD-10-CM

## 2022-12-07 DIAGNOSIS — M79671 Pain in right foot: Secondary | ICD-10-CM

## 2022-12-07 DIAGNOSIS — M21619 Bunion of unspecified foot: Secondary | ICD-10-CM

## 2022-12-07 DIAGNOSIS — M79672 Pain in left foot: Secondary | ICD-10-CM | POA: Diagnosis not present

## 2022-12-07 DIAGNOSIS — M722 Plantar fascial fibromatosis: Secondary | ICD-10-CM

## 2022-12-07 MED ORDER — DEXAMETHASONE SODIUM PHOSPHATE 120 MG/30ML IJ SOLN
4.0000 mg | Freq: Once | INTRAMUSCULAR | Status: AC
  Administered 2022-12-07: 4 mg via INTRA_ARTICULAR

## 2022-12-07 MED ORDER — METHYLPREDNISOLONE 4 MG PO TBPK: ORAL_TABLET | ORAL | 0 refills | Status: DC

## 2022-12-07 NOTE — Patient Instructions (Signed)

## 2022-12-07 NOTE — Progress Notes (Signed)
  Subjective:  Patient ID: Marissa Coleman, female    DOB: 04-17-1966,   MRN: [id number]  Chief Complaint  Patient presents with   Foot Pain    Pain to right heel, and lateral aspect of left foot. No injuries noted. Started about 2 months ago.    Numbness    Numbness to first 3 toes on left foot.     57 y.o. female presents for concern of bilateral foot pain that has been going on for about 2 months. Relates most of the pain is on the right on the side of her foot and ankle. She also relates numbness in her first three toes on the left and pain on the outside of her left foot due to walking differently and compensating for other pain.   Does have a history of back surgery and lumbar issues affecting the left leg . Denies any other pedal complaints. Denies n/v/f/c.   History reviewed. No pertinent past medical history.  Objective:  Physical Exam: Vascular: DP/PT pulses 2/4 bilateral. CFT <3 seconds. Normal hair growth on digits. No edema.  Skin. No lacerations or abrasions bilateral feet.  Musculoskeletal: MMT 5/5 bilateral lower extremities in DF, PF, Inversion and Eversion. Deceased ROM in DF of ankle joint.  Neurological: Sensation intact to light touch.   Assessment:   1. Plantar fasciitis of right foot   2. Lumbar radiculopathy      Plan:  Patient was evaluated and treated and all questions answered. X-rays reviewed and discussed with patient. No acute fractures or dislocations noted.  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 Mild spurring to plantar calcaneus on right.  Discussed plantar fasciitis with patient.  Discussed treatment options including, ice, NSAIDS, supportive shoes, bracing, and stretching. Stretching exercises provided to be done on a daily basis.   Prescription for medrol dose pack provided and sent to pharmacy. Patient requesting injection today. Procedure note  below.   Pf brace dispensed.  Follow-up 6 weeks or sooner if any problems arise. In the meantime, encouraged to call the office with any questions, concerns, change in symptoms.   Procedure:  Discussed etiology, pathology, conservative vs. surgical therapies. At this time a plantar fascial injection was recommended.  The patient agreed and a sterile skin prep was applied.  An injection consisting of  dexamethasone and marcaine mixture was infiltrated at the point of maximal tenderness on the right Heel.  Bandaid applied. The patient tolerated this well and was given instructions for aftercare.     Lorenda Peck, DPM

## 2022-12-16 ENCOUNTER — Other Ambulatory Visit: Payer: Self-pay | Admitting: Family Medicine

## 2023-01-19 ENCOUNTER — Ambulatory Visit: Payer: BC Managed Care – PPO | Admitting: Podiatry

## 2023-01-26 ENCOUNTER — Ambulatory Visit (INDEPENDENT_AMBULATORY_CARE_PROVIDER_SITE_OTHER): Payer: BC Managed Care – PPO | Admitting: Family Medicine

## 2023-01-26 ENCOUNTER — Encounter: Payer: Self-pay | Admitting: Family Medicine

## 2023-01-26 VITALS — BP 139/85 | HR 69 | Ht 66.0 in | Wt 194.0 lb

## 2023-01-26 DIAGNOSIS — M25529 Pain in unspecified elbow: Secondary | ICD-10-CM

## 2023-01-26 DIAGNOSIS — R413 Other amnesia: Secondary | ICD-10-CM | POA: Diagnosis not present

## 2023-01-26 DIAGNOSIS — M722 Plantar fascial fibromatosis: Secondary | ICD-10-CM | POA: Diagnosis not present

## 2023-01-26 DIAGNOSIS — E038 Other specified hypothyroidism: Secondary | ICD-10-CM | POA: Diagnosis not present

## 2023-01-26 DIAGNOSIS — M545 Low back pain, unspecified: Secondary | ICD-10-CM

## 2023-01-26 DIAGNOSIS — Z114 Encounter for screening for human immunodeficiency virus [HIV]: Secondary | ICD-10-CM | POA: Diagnosis not present

## 2023-01-26 DIAGNOSIS — M79642 Pain in left hand: Secondary | ICD-10-CM

## 2023-01-26 DIAGNOSIS — M79605 Pain in left leg: Secondary | ICD-10-CM

## 2023-01-26 DIAGNOSIS — G8929 Other chronic pain: Secondary | ICD-10-CM

## 2023-01-26 DIAGNOSIS — M79641 Pain in right hand: Secondary | ICD-10-CM

## 2023-01-26 DIAGNOSIS — G5603 Carpal tunnel syndrome, bilateral upper limbs: Secondary | ICD-10-CM | POA: Diagnosis not present

## 2023-01-26 DIAGNOSIS — Z1159 Encounter for screening for other viral diseases: Secondary | ICD-10-CM | POA: Diagnosis not present

## 2023-01-26 DIAGNOSIS — E559 Vitamin D deficiency, unspecified: Secondary | ICD-10-CM | POA: Diagnosis not present

## 2023-01-26 DIAGNOSIS — G43909 Migraine, unspecified, not intractable, without status migrainosus: Secondary | ICD-10-CM

## 2023-01-26 DIAGNOSIS — R7301 Impaired fasting glucose: Secondary | ICD-10-CM | POA: Diagnosis not present

## 2023-01-26 MED ORDER — AMITRIPTYLINE HCL 10 MG PO TABS: 10.0000 mg | ORAL_TABLET | Freq: Every day | ORAL | 1 refills | Status: DC

## 2023-01-26 MED ORDER — SUMATRIPTAN SUCCINATE 50 MG PO TABS: 50.0000 mg | ORAL_TABLET | ORAL | 0 refills | Status: DC | PRN

## 2023-01-26 MED ORDER — DICLOFENAC SODIUM 75 MG PO TBEC: 75.0000 mg | DELAYED_RELEASE_TABLET | ORAL | 0 refills | Status: DC | PRN

## 2023-01-26 MED ORDER — CYCLOBENZAPRINE HCL 10 MG PO TABS: 10.0000 mg | ORAL_TABLET | Freq: Three times a day (TID) | ORAL | 0 refills | Status: AC | PRN

## 2023-01-26 NOTE — Patient Instructions (Signed)
I appreciate the opportunity to provide care to you today!    Follow up:  3 months  Labs: please stop by the lab today to get your blood drawn (CBC, CMP, TSH, Lipid profile, HgA1c, Vit D)  Screening: RF, ESR and CRP  Referrals today-  orthopedic surgery in [place]   Please continue to a heart-healthy diet and increase your physical activities. Try to exercise for 73mins at least five days a week.      It was a pleasure to see you and I look forward to continuing to work together on your health and well-being. Please do not hesitate to call the office if you need care or have questions about your care.   Have a wonderful day and week. With Gratitude, Alvira Monday MSN, FNP-BC

## 2023-01-26 NOTE — Progress Notes (Signed)
Established Patient Office Visit  Subjective:  Patient ID: Marissa Coleman, female    DOB: 1966/01/22  Age: 57 y.o. MRN: [id number]  CC:  Chief Complaint  Patient presents with   Follow-up    4 month f/u. Pt report right heel foot pain ongoing since last office visit, went to see podiatry on her own on 12/07/2022, nothing was done per patient. Would like to discuss vitamins/medication for memory.    Joint Swelling    Pt reports elbow burning onset since 08/14/2023.     HPI Tanley Coleman is a 57 y.o. female with past medical history of plantar fasciitis, and lumbar pain with radiation down the left leg presents for f/u of  chronic medical conditions. For the details of today's visit, please refer to the assessment and plan.     History reviewed. No pertinent past medical history.  Past Surgical History:  Procedure Laterality Date   BREAST CYST ASPIRATION Right 04/05/2016    Family History  Problem Relation Age of Onset   Breast cancer Mother 27   Breast cancer Maternal Aunt 43    Social History   Socioeconomic History   Marital status: Divorced    Spouse name: Not on file   Number of children: Not on file   Years of education: Not on file   Highest education level: Not on file  Occupational History   Not on file  Tobacco Use   Smoking status: Never   Smokeless tobacco: Not on file  Substance and Sexual Activity   Alcohol use: Not on file   Drug use: Not on file   Sexual activity: Not on file  Other Topics Concern   Not on file  Social History Narrative   Not on file   Social Determinants of Health   Financial Resource Strain: Not on file  Food Insecurity: Not on file  Transportation Needs: Not on file  Physical Activity: Not on file  Stress: Not on file  Social Connections: Not on file  Intimate Partner Violence: Not on file    Outpatient Medications Prior to Visit  Medication Sig Dispense Refill   Cholecalciferol (VITAMIN D3) 3000 UNITS TABS Take by  mouth.     EPINEPHrine 0.3 mg/0.3 mL IJ SOAJ injection See admin instructions.     Magnesium Oxide 200 MG TABS See admin instructions.     Riboflavin 400 MG CAPS      amitriptyline (ELAVIL) 10 MG tablet Take by mouth at bedtime.     cyclobenzaprine (FLEXERIL) 10 MG tablet      diclofenac (VOLTAREN) 75 MG EC tablet      methylPREDNISolone (MEDROL DOSEPAK) 4 MG TBPK tablet Take as directed 21 tablet 0   SUMAtriptan (IMITREX) 50 MG tablet Take by mouth.     No facility-administered medications prior to visit.    Allergies  Allergen Reactions   Morphine Nausea And Vomiting   Penicillins    Shellfish Allergy     ROS Review of Systems  Constitutional:  Negative for chills and fever.  Eyes:  Negative for visual disturbance.  Respiratory:  Negative for chest tightness and shortness of breath.   Neurological:  Negative for dizziness and headaches.      Objective:    Physical Exam HENT:     Head: Normocephalic.     Mouth/Throat:     Mouth: Mucous membranes are moist.  Cardiovascular:     Rate and Rhythm: Normal rate.     Heart sounds: Normal heart sounds.  Pulmonary:     Effort: Pulmonary effort is normal.     Breath sounds: Normal breath sounds.  Musculoskeletal:     Right elbow: No swelling. Tenderness present.     Left elbow: Tenderness present.     Comments: Tenderness with palpation at the olecranon process Range of motion is intact bilaterally No evidence of inflammation noted  Neurological:     Mental Status: She is alert.     BP 139/85   Pulse 69   Ht 5\' 6"  (1.676 m)   Wt 194 lb 0.6 oz (88 kg)   LMP 04/29/2017 (Exact Date)   SpO2 98%   BMI 31.32 kg/m  Wt Readings from Last 3 Encounters:  01/26/23 194 lb 0.6 oz (88 kg)  11/14/22 190 lb 1.3 oz (86.2 kg)  09/27/22 189 lb 1.9 oz (85.8 kg)    Lab Results  Component Value Date   TSH 0.784 01/26/2023   Lab Results  Component Value Date   WBC 4.4 01/26/2023   HGB 11.9 01/26/2023   HCT 36.7 01/26/2023    MCV 97 01/26/2023   PLT 198 01/26/2023   Lab Results  Component Value Date   NA 143 01/26/2023   K 4.2 01/26/2023   CO2 24 01/26/2023   GLUCOSE 67 (L) 01/26/2023   BUN 14 01/26/2023   CREATININE 1.18 (H) 01/26/2023   BILITOT 0.3 01/26/2023   ALKPHOS 63 01/26/2023   AST 18 01/26/2023   ALT 12 01/26/2023   PROT 6.4 01/26/2023   ALBUMIN 4.0 01/26/2023   CALCIUM 9.4 01/26/2023   EGFR 55 (L) 01/26/2023   Lab Results  Component Value Date   CHOL 179 01/26/2023   Lab Results  Component Value Date   HDL 52 01/26/2023   Lab Results  Component Value Date   LDLCALC 99 01/26/2023   Lab Results  Component Value Date   TRIG 161 (H) 01/26/2023   Lab Results  Component Value Date   CHOLHDL 3.4 01/26/2023   Lab Results  Component Value Date   HGBA1C 5.6 01/26/2023      Assessment & Plan:  Plantar fasciitis Assessment & Plan: She saw the podiatrist on 12/06/2022 and was noted to have plantar fasciitis of the right foot She was informed that treatment options included ice, NSAIDs, supportive shoes, bracing, and stretching A prescription for Medrol Dosepak was sent to the pharmacy and she received steroid injections  Encouraged the patient to continue the treatment regimen and follow-up with her podiatrist as scheduled   Bad memory Assessment & Plan: Complains of having brain fog in the last few months Will like to know of supplements that she can take to help sharpen her memory MMSE score is 30 Encouraged the patient to increase her intake of green leafy vegetables, increase her physical activities, and play board games to stimulate her memory   Carpal tunnel syndrome on both sides Assessment & Plan: She reports having carpal tunnel in both wrist She reported having carpal tunnel surgery in 2010 and needed to have surgery on the right wrist but was unable to undergo the procedure due to increased life stresses Will place a referral into orthopedic surgery to further  assess and possibly schedule the patient for carpal tunnel surgery of the right wrist    Orders: -     Ambulatory referral to Orthopedic Surgery  Chronic elbow pain, unspecified laterality Assessment & Plan: Complains of bilateral elbow pain Family history of arthritis No evidence of inflammation noted No deformity noted  Range of motion is intact Tenderness with palpation at the olecranon process noted No recent trauma or injury to the affected site reported Encouraged to continue taking over-the-counter analgesic as needed for pain control Will assess inflammatory markers today   Bilateral hand pain Assessment & Plan: Family history of arthritis Reports swelling of her hands that occurs spontaneously and is self-limiting No deformity or evidence of inflammation reported Will assess inflammatory markers today Encouraged to continue taking over-the-counter NSAIDs, and decrease her salt intake  Orders: -     Rheumatoid factor -     Sedimentation rate -     C-reactive protein  Migraine syndrome -     SUMAtriptan Succinate; Take 1 tablet (50 mg total) by mouth every 2 (two) hours as needed for migraine.  Dispense: 10 tablet; Refill: 0  Lumbar pain with radiation down left leg -     Cyclobenzaprine HCl; Take 1 tablet (10 mg total) by mouth 3 (three) times daily as needed for muscle spasms.  Dispense: 30 tablet; Refill: 0 -     Amitriptyline HCl; Take 1 tablet (10 mg total) by mouth at bedtime.  Dispense: 30 tablet; Refill: 1 -     Diclofenac Sodium; Take 1 tablet (75 mg total) by mouth as needed for mild pain or moderate pain.  Dispense: 90 tablet; Refill: 0    Follow-up: Return in about 3 months (around 04/28/2023).   Alvira Monday, FNP

## 2023-01-27 DIAGNOSIS — G5603 Carpal tunnel syndrome, bilateral upper limbs: Secondary | ICD-10-CM | POA: Insufficient documentation

## 2023-01-27 DIAGNOSIS — R413 Other amnesia: Secondary | ICD-10-CM | POA: Insufficient documentation

## 2023-01-27 DIAGNOSIS — M79641 Pain in right hand: Secondary | ICD-10-CM | POA: Insufficient documentation

## 2023-01-27 DIAGNOSIS — M722 Plantar fascial fibromatosis: Secondary | ICD-10-CM | POA: Insufficient documentation

## 2023-01-27 DIAGNOSIS — G8929 Other chronic pain: Secondary | ICD-10-CM | POA: Insufficient documentation

## 2023-01-27 LAB — CMP14+EGFR
ALT: 12 IU/L (ref 0–32)
AST: 18 IU/L (ref 0–40)
Albumin/Globulin Ratio: 1.7 (ref 1.2–2.2)
Albumin: 4 g/dL (ref 3.8–4.9)
Alkaline Phosphatase: 63 IU/L (ref 44–121)
BUN/Creatinine Ratio: 12 (ref 9–23)
BUN: 14 mg/dL (ref 6–24)
Bilirubin Total: 0.3 mg/dL (ref 0.0–1.2)
CO2: 24 mmol/L (ref 20–29)
Calcium: 9.4 mg/dL (ref 8.7–10.2)
Chloride: 104 mmol/L (ref 96–106)
Creatinine, Ser: 1.18 mg/dL — ABNORMAL HIGH (ref 0.57–1.00)
Globulin, Total: 2.4 g/dL (ref 1.5–4.5)
Glucose: 67 mg/dL — ABNORMAL LOW (ref 70–99)
Potassium: 4.2 mmol/L (ref 3.5–5.2)
Sodium: 143 mmol/L (ref 134–144)
Total Protein: 6.4 g/dL (ref 6.0–8.5)
eGFR: 55 mL/min/{1.73_m2} — ABNORMAL LOW (ref >59)

## 2023-01-27 LAB — HEPATITIS C ANTIBODY: Hep C Virus Ab: NONREACTIVE

## 2023-01-27 LAB — CBC WITH DIFFERENTIAL/PLATELET
Basophils Absolute: 0 10*3/uL (ref 0.0–0.2)
Basos: 1 %
EOS (ABSOLUTE): 0.2 10*3/uL (ref 0.0–0.4)
Eos: 3 %
Hematocrit: 36.7 % (ref 34.0–46.6)
Hemoglobin: 11.9 g/dL (ref 11.1–15.9)
Immature Grans (Abs): 0 10*3/uL (ref 0.0–0.1)
Immature Granulocytes: 0 %
Lymphocytes Absolute: 0.9 10*3/uL (ref 0.7–3.1)
Lymphs: 21 %
MCH: 31.6 pg (ref 26.6–33.0)
MCHC: 32.4 g/dL (ref 31.5–35.7)
MCV: 97 fL (ref 79–97)
Monocytes Absolute: 0.4 10*3/uL (ref 0.1–0.9)
Monocytes: 9 %
Neutrophils Absolute: 2.9 10*3/uL (ref 1.4–7.0)
Neutrophils: 66 %
Platelets: 198 10*3/uL (ref 150–450)
RBC: 3.77 x10E6/uL (ref 3.77–5.28)
RDW: 13.2 % (ref 11.7–15.4)
WBC: 4.4 10*3/uL (ref 3.4–10.8)

## 2023-01-27 LAB — TSH+FREE T4
Free T4: 1.22 ng/dL (ref 0.82–1.77)
TSH: 0.784 u[IU]/mL (ref 0.450–4.500)

## 2023-01-27 LAB — HEMOGLOBIN A1C
Est. average glucose Bld gHb Est-mCnc: 114 mg/dL
Hgb A1c MFr Bld: 5.6 % (ref 4.8–5.6)

## 2023-01-27 LAB — LIPID PANEL
Chol/HDL Ratio: 3.4 ratio (ref 0.0–4.4)
Cholesterol, Total: 179 mg/dL (ref 100–199)
HDL: 52 mg/dL (ref >39)
LDL Chol Calc (NIH): 99 mg/dL (ref 0–99)
Triglycerides: 161 mg/dL — ABNORMAL HIGH (ref 0–149)
VLDL Cholesterol Cal: 28 mg/dL (ref 5–40)

## 2023-01-27 LAB — HIV ANTIBODY (ROUTINE TESTING W REFLEX): HIV Screen 4th Generation wRfx: NONREACTIVE

## 2023-01-27 LAB — VITAMIN D 25 HYDROXY (VIT D DEFICIENCY, FRACTURES): Vit D, 25-Hydroxy: 27.8 ng/mL — ABNORMAL LOW (ref 30.0–100.0)

## 2023-01-27 NOTE — Assessment & Plan Note (Addendum)
She saw the podiatrist on 12/06/2022 and was noted to have plantar fasciitis of the right foot She was informed that treatment options included ice, NSAIDs, supportive shoes, bracing, and stretching A prescription for Medrol Dosepak was sent to the pharmacy and she received steroid injections  Encouraged the patient to continue the treatment regimen and follow-up with her podiatrist as scheduled

## 2023-01-27 NOTE — Assessment & Plan Note (Addendum)
Family history of arthritis Reports swelling of her hands that occurs spontaneously and is self-limiting No deformity or evidence of inflammation reported Will assess inflammatory markers today Encouraged to continue taking over-the-counter NSAIDs, and decrease her salt intake

## 2023-01-27 NOTE — Assessment & Plan Note (Signed)
She reports having carpal tunnel in both wrist She reported having carpal tunnel surgery in 2010 and needed to have surgery on the right wrist but was unable to undergo the procedure due to increased life stresses Will place a referral into orthopedic surgery to further assess and possibly schedule the patient for carpal tunnel surgery of the right wrist

## 2023-01-27 NOTE — Assessment & Plan Note (Signed)
Complains of having brain fog in the last few months Will like to know of supplements that she can take to help sharpen her memory MMSE score is 30 Encouraged the patient to increase her intake of green leafy vegetables, increase her physical activities, and play board games to stimulate her memory

## 2023-01-27 NOTE — Assessment & Plan Note (Signed)
Complains of bilateral elbow pain Family history of arthritis No evidence of inflammation noted No deformity noted Range of motion is intact Tenderness with palpation at the olecranon process noted No recent trauma or injury to the affected site reported Encouraged to continue taking over-the-counter analgesic as needed for pain control Will assess inflammatory markers today

## 2023-01-28 LAB — C-REACTIVE PROTEIN: CRP: 2 mg/L (ref 0–10)

## 2023-01-28 LAB — SEDIMENTATION RATE: Sed Rate: 2 mm/hr (ref 0–40)

## 2023-01-28 LAB — RHEUMATOID FACTOR: Rheumatoid fact SerPl-aCnc: <10 IU/mL (ref <14.0)

## 2023-01-28 NOTE — Progress Notes (Signed)
  Please inform the patient that her rheumatoid factor and inflammatory markers were negative. Please encourage her to follow up with the referral place to orthopedic surgery

## 2023-02-22 ENCOUNTER — Other Ambulatory Visit (INDEPENDENT_AMBULATORY_CARE_PROVIDER_SITE_OTHER): Payer: BC Managed Care – PPO

## 2023-02-22 ENCOUNTER — Ambulatory Visit (INDEPENDENT_AMBULATORY_CARE_PROVIDER_SITE_OTHER): Payer: BC Managed Care – PPO | Admitting: Orthopaedic Surgery

## 2023-02-22 ENCOUNTER — Encounter: Payer: Self-pay | Admitting: Orthopaedic Surgery

## 2023-02-22 VITALS — Ht 66.0 in | Wt 189.0 lb

## 2023-02-22 DIAGNOSIS — G5603 Carpal tunnel syndrome, bilateral upper limbs: Secondary | ICD-10-CM

## 2023-02-22 DIAGNOSIS — G8929 Other chronic pain: Secondary | ICD-10-CM

## 2023-02-22 DIAGNOSIS — M25561 Pain in right knee: Secondary | ICD-10-CM

## 2023-02-22 DIAGNOSIS — M542 Cervicalgia: Secondary | ICD-10-CM

## 2023-02-22 DIAGNOSIS — M4722 Other spondylosis with radiculopathy, cervical region: Secondary | ICD-10-CM

## 2023-02-22 DIAGNOSIS — M1711 Unilateral primary osteoarthritis, right knee: Secondary | ICD-10-CM

## 2023-02-22 MED ORDER — BUPIVACAINE HCL 0.25 % IJ SOLN
4.0000 mL | INTRAMUSCULAR | Status: AC | PRN
  Administered 2023-02-22: 4 mL via INTRA_ARTICULAR

## 2023-02-22 MED ORDER — LIDOCAINE HCL 1 % IJ SOLN
0.5000 mL | INTRAMUSCULAR | Status: AC | PRN
  Administered 2023-02-22: .5 mL

## 2023-02-22 MED ORDER — METHYLPREDNISOLONE ACETATE 40 MG/ML IJ SUSP
40.0000 mg | INTRAMUSCULAR | Status: AC | PRN
  Administered 2023-02-22: 40 mg via INTRA_ARTICULAR

## 2023-02-22 NOTE — Progress Notes (Signed)
Office Visit Note   Patient: Marissa Coleman           Date of Birth: Aug 31, 1966           MRN: [id number] Visit Date: 02/22/2023              Requested by: Gilmore Laroche, FNP [address] [place],  [place] [address] PCP: Gilmore Laroche, FNP   Assessment & Plan: Visit Diagnoses:  1. Neck pain   2. Chronic pain of right knee   3. Unilateral primary osteoarthritis, right knee   4. Cervicalgia   5. Carpal tunnel syndrome on both sides   6. Other spondylosis with radiculopathy, cervical region     Plan: Right knee injection performed which she tolerated well.  Will set up for a cervical  MRI scan for cervical spondylosis with bilateral hand numbness C6 distribution and multilevel spondylosis.  Office follow-up after cervical MRI.  She has had left carpal tunnel release already denies having progressive right hand symptoms.  Follow-up after cervical MRI and we can make a decision about carpal tunnel release versus possible need for neck as well as carpal tunnel release surgery.  Follow-Up Instructions: No follow-ups on file.   Orders:  Orders Placed This Encounter  Procedures   Large Joint Inj: R knee   XR Cervical Spine 2 or 3 views   XR KNEE 3 VIEW RIGHT   MR Cervical Spine w/o contrast   No orders of the defined types were placed in this encounter.     Procedures: Large Joint Inj: R knee on 02/22/2023 2:19 PM Indications: pain and joint swelling Details: 22 G 1.5 in needle, anterolateral approach  Arthrogram: No  Medications: 40 mg methylPREDNISolone acetate 40 MG/ML; 0.5 mL lidocaine 1 %; 4 mL bupivacaine 0.25 % Outcome: tolerated well, no immediate complications Procedure, treatment alternatives, risks and benefits explained, specific risks discussed. Consent was given by the patient. Immediately prior to procedure a time out was called to verify the correct patient, procedure, equipment, support staff and site/side marked as required. Patient was prepped and draped  in the usual sterile fashion.       Clinical Data: No additional findings.   Subjective: Chief Complaint  Patient presents with   Neck - Pain   Right Knee - Pain   Right Hand - Numbness   Left Hand - Numbness    HPI 57 year old female here on her birthday with multiple problems.  She has had 2 lumbar fusions with some residual back pain but better than before surgery.  She has got bilateral hand numbness had previous left carpal tunnel release 2010 and continues to have increased right carpal tunnel syndrome numbness that wakes her up at night in the right hand.  Additionally she has pain in her neck with rotation and turning.  She has weakness in the arm more in the shoulder on the right than left side.  Also had increased pain in her right knee with crepitus intermittent swelling difficulty walking going up and down stairs.  She states she was recently tested for rheumatoid arthritis which was negative.  She is use ibuprofen and also is on some amitriptyline.  Review of Systems positive plantar fasciitis with multiple inserts.  She ambulates most the time trying to avoid heel strike on the right.   Objective: Vital Signs: Ht 5\' 6"  (1.676 m)   Wt 189 lb (85.7 kg)   LMP 04/29/2017 (Exact Date)   BMI 30.51 kg/m   Physical  Exam Constitutional:      Appearance: She is well-developed.  HENT:     Head: Normocephalic.     Right Ear: External ear normal.     Left Ear: External ear normal. There is no impacted cerumen.  Eyes:     Pupils: Pupils are equal, round, and reactive to light.  Neck:     Thyroid: No thyromegaly.     Trachea: No tracheal deviation.  Cardiovascular:     Rate and Rhythm: Normal rate.  Pulmonary:     Effort: Pulmonary effort is normal.  Abdominal:     Palpations: Abdomen is soft.  Musculoskeletal:     Cervical back: No rigidity.  Skin:    General: Skin is warm and dry.  Neurological:     Mental Status: She is alert and oriented to person, place, and  time.  Psychiatric:        Behavior: Behavior normal.     Ortho Exam negative straight leg raising 90 degrees negative logroll hips.  Right knee crepitus 2+ synovitis positive patellofemoral grind test.  Mild genu valgum worse on the right knee than left knee 15 degrees.  Bilateral positive Spurling worse on the right than left increased pain cervical compression no relief with distraction pain and limited forward flexion 50% extension 70%.  Upper extremity reflexes are 2+ and symmetrical positive impingement right shoulder negative left shoulder long of the biceps is tender.  Negative drop arm test.  Negative empty can test.  Specialty Comments:  No specialty comments available.  Imaging: XR KNEE 3 VIEW RIGHT  Result Date: 02/22/2023 Standing AP both knees lateral right knee obtained and reviewed.  There is right greater than left knee osteoarthritis mild valgus marginal osteophytes subchondral sclerosis subchondral cyst formation tricompartmental right knee worse than left knee. Impression: Right knee osteoarthritis with mild valgus.  XR Cervical Spine 2 or 3 views  Result Date: 02/22/2023 AP lateral cervical spine images are obtained and reviewed.  This shows multilevel mid cervical spondylosis with 1 to 2 mm anterolisthesis C3-4, C4-5.  For percent narrowing C5-6 with endplate spurring anterior and posterior.  Relative sparing C6-7 level. Impression multilevel cervical spondylosis as described above.    PMFS History: Patient Active Problem List   Diagnosis Date Noted   Other spondylosis with radiculopathy, cervical region 02/22/2023   Bilateral hand pain 01/27/2023   Elbow pain, chronic 01/27/2023   Carpal tunnel syndrome on both sides 01/27/2023   Plantar fasciitis 01/27/2023   Bad memory 01/27/2023   Hematuria, microscopic 11/16/2022   Encounter for general adult medical examination with abnormal findings 09/27/2022   Need for immunization against influenza 09/27/2022   Pap  smear for cervical cancer screening 09/27/2022   Lumbar pain with radiation down left leg 08/16/2022   No past medical history on file.  Family History  Problem Relation Age of Onset   Breast cancer Mother 5   Breast cancer Maternal Aunt 23    Past Surgical History:  Procedure Laterality Date   BREAST CYST ASPIRATION Right 04/05/2016   Social History   Occupational History   Not on file  Tobacco Use   Smoking status: Never   Smokeless tobacco: Not on file  Substance and Sexual Activity   Alcohol use: Not on file   Drug use: Not on file   Sexual activity: Not on file

## 2023-03-01 ENCOUNTER — Telehealth: Payer: Self-pay | Admitting: Radiology

## 2023-03-01 DIAGNOSIS — M542 Cervicalgia: Secondary | ICD-10-CM

## 2023-03-01 NOTE — Telephone Encounter (Signed)
Marissa Coleman received denial from insurance in regards to MRI cervical spine. Primary insurance denied, however, secondary insurance approved. Primary requires 6 weeks of conservative treatment.  Would you like to order PT or do peer to peer?

## 2023-03-01 NOTE — Telephone Encounter (Signed)
I left voicemail requesting return call to let me know where patient would like to be sent for therapy.

## 2023-03-01 NOTE — Addendum Note (Signed)
Addended by: Rogers Seeds on: 03/01/2023 05:00 PM   Modules accepted: Orders

## 2023-03-01 NOTE — Telephone Encounter (Signed)
Patient returned call and would like to go to PT in [place]. Referral entered.

## 2023-03-13 ENCOUNTER — Other Ambulatory Visit: Payer: Self-pay

## 2023-03-13 ENCOUNTER — Ambulatory Visit: Payer: BC Managed Care – PPO | Attending: Orthopaedic Surgery

## 2023-03-13 DIAGNOSIS — M5412 Radiculopathy, cervical region: Secondary | ICD-10-CM | POA: Insufficient documentation

## 2023-03-13 DIAGNOSIS — M542 Cervicalgia: Secondary | ICD-10-CM | POA: Insufficient documentation

## 2023-03-13 NOTE — Therapy (Signed)
OUTPATIENT PHYSICAL THERAPY CERVICAL EVALUATION   Patient Name: Marissa Coleman MRN: [id number] DOB:1966/02/19, 57 y.o., female Today's Date: 03/13/2023  END OF SESSION:  PT End of Session - 03/13/23 0955     Visit Number 1    Number of Visits 6    Date for PT Re-Evaluation 04/13/23    PT Start Time 0956   Patietn arrived late to her appointment.   PT Stop Time 1025    PT Time Calculation (min) 29 min    Activity Tolerance Patient tolerated treatment well    Behavior During Therapy [facility] for tasks assessed/performed             History reviewed. No pertinent past medical history. Past Surgical History:  Procedure Laterality Date   BREAST CYST ASPIRATION Right 04/05/2016   Patient Active Problem List   Diagnosis Date Noted   Other spondylosis with radiculopathy, cervical region 02/22/2023   Unilateral primary osteoarthritis, right knee 02/22/2023   Bilateral hand pain 01/27/2023   Elbow pain, chronic 01/27/2023   Carpal tunnel syndrome on both sides 01/27/2023   Plantar fasciitis 01/27/2023   Bad memory 01/27/2023   Hematuria, microscopic 11/16/2022   Encounter for general adult medical examination with abnormal findings 09/27/2022   Need for immunization against influenza 09/27/2022   Pap smear for cervical cancer screening 09/27/2022   Lumbar pain with radiation down left leg 08/16/2022   REFERRING PROVIDER: Eldred Manges, MD   REFERRING DIAG: Neck pain   THERAPY DIAG:  Radiculopathy, cervical region  Rationale for Evaluation and Treatment: Rehabilitation  ONSET DATE: 1 year ago   SUBJECTIVE:                                                                                                                                                                                                         SUBJECTIVE STATEMENT: Patient reports that she has been having neck and bilateral hand pain for the past year. She feels that her right hand is worse than her left, but she has  numbness in the first three fingers of each hand. This has been effecting her ability to grip and hold items. She was told that she needed to try physical therapy before they would do anything else to her neck. She has never had any pain or problems like this before.  Hand dominance: Right  PERTINENT HISTORY:  Osteoarthritis, chronic low back pain, and memory impairments  PAIN:  Are you having pain? Yes: NPRS scale: 8/10 Pain location: neck and both hands Pain description: numb and aching intermittently Aggravating  factors: moving, work, lifting, carrying, sleeping Relieving factors: medication  PRECAUTIONS: None  WEIGHT BEARING RESTRICTIONS: No  FALLS:  Has patient fallen in last 6 months? Yes. 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  POSTURE: forward head  PALPATION: Familiar pain reproduced by palpation to cervical paraspinals   CERVICAL ROM:   Active ROM A/PROM (deg) eval  Flexion 44  Extension 28  Right lateral flexion 16  Left lateral flexion 28; soreness   Right rotation 45  Left rotation 41   (Blank rows = not tested)  UPPER EXTREMITY ROM: limited by right shoulder pain  UPPER EXTREMITY MMT:  MMT Right eval Left eval  Shoulder flexion 3+/5; limited by right shoulder pain  3+/5  Shoulder extension    Shoulder abduction    Shoulder adduction    Shoulder extension    Shoulder internal rotation    Shoulder external rotation    Middle trapezius    Lower  trapezius    Elbow flexion 3+/5 3+/5  Elbow extension 3+/5 3+/5  Wrist flexion    Wrist extension    Wrist ulnar deviation    Wrist radial deviation    Wrist pronation    Wrist supination    Grip strength 5 10   (Blank rows = not tested)  CERVICAL SPECIAL TESTS:  Spurling's test: Negative, Distraction test: Positive, and Sharp pursor's test: Negative  TODAY'S TREATMENT:                                                                                                                              DATE:                                     4/30 EXERCISE LOG  Exercise Repetitions and Resistance Comments  Self cervical distraction Attempted, but held due to reproduced pain                    Blank cell = exercise not performed today   PATIENT EDUCATION:  Education details: Plan of care, healing, prognosis, and goals for therapy Person educated: Patient Education method: Explanation Education comprehension: verbalized understanding  HOME EXERCISE PROGRAM:   ASSESSMENT:  CLINICAL IMPRESSION: Patient is a 57 y.o. female who was seen today for physical therapy evaluation and treatment for bilateral cervical radiculopathy.  She presented with moderate to high pain severity and irritability with palpation  to her cervical paraspinals being the most aggravating to her familiar symptoms.  She exhibited reduced cervical active range of motion and upper extremity muscular strength bilaterally.  Recommend that she continue with skilled physical therapy to address her impairments to maximize her functional mobility.  OBJECTIVE IMPAIRMENTS: decreased activity tolerance, decreased mobility, decreased ROM, decreased strength, hypomobility, impaired sensation, impaired tone, impaired UE functional use, postural dysfunction, and pain.   ACTIVITY LIMITATIONS: carrying, lifting, sleeping, and reach over head  PARTICIPATION LIMITATIONS: meal prep, cleaning, laundry, shopping, community activity, and  yard work  PERSONAL FACTORS: Time since onset of injury/illness/exacerbation and 3+ comorbidities: Osteoarthritis, chronic low back pain, and memory impairments  are also affecting patient's functional outcome.   REHAB POTENTIAL: Fair    CLINICAL DECISION MAKING: Evolving/moderate complexity  EVALUATION COMPLEXITY: Moderate   GOALS: Goals reviewed with patient? Yes  LONG TERM GOALS: Target date: 04/03/23  Patient will be independent with her HEP. Baseline:  Goal status: INITIAL  2.  Patient will be able to complete her daily activities without her familiar pain exceeding 6/10. Baseline:  Goal status: INITIAL  3.  Patient will be able to demonstrate at least 50 degrees of cervical rotation bilaterally for improved awareness of her surroundings. Baseline:  Goal status: INITIAL  4.  Patient will improve her grip strength to at least 15 pounds bilaterally for improved function grasping items. Baseline:  Goal status: INITIAL  PLAN:  PT FREQUENCY: 2x/week  PT DURATION: 3 weeks  PLANNED INTERVENTIONS: Therapeutic exercises, Therapeutic activity, Neuromuscular re-education, Patient/Family education, Self Care, Joint mobilization, Dry Needling, Electrical stimulation, Spinal mobilization, Cryotherapy, Moist heat, Traction, Ultrasound, Manual therapy, and Re-evaluation  PLAN FOR NEXT SESSION: Chin tucks, isometrics, scapular retractions, and manual therapy   Granville Lewis, PT 03/13/2023, 12:41 PM

## 2023-03-16 ENCOUNTER — Ambulatory Visit: Payer: BC Managed Care – PPO | Attending: Orthopaedic Surgery

## 2023-03-16 DIAGNOSIS — M5412 Radiculopathy, cervical region: Secondary | ICD-10-CM | POA: Insufficient documentation

## 2023-03-16 NOTE — Therapy (Signed)
OUTPATIENT PHYSICAL THERAPY CERVICAL EVALUATION   Patient Name: Marissa Coleman MRN: [id number] DOB:October 22, 1966, 57 y.o., female Today's Date: 03/16/2023  END OF SESSION:  PT End of Session - 03/16/23 0956     Visit Number 2    Number of Visits 6    Date for PT Re-Evaluation 04/13/23    PT Start Time 0954    PT Stop Time 1040    PT Time Calculation (min) 46 min    Activity Tolerance Patient tolerated treatment well    Behavior During Therapy [facility] for tasks assessed/performed             History reviewed. No pertinent past medical history. Past Surgical History:  Procedure Laterality Date   BREAST CYST ASPIRATION Right 04/05/2016   Patient Active Problem List   Diagnosis Date Noted   Other spondylosis with radiculopathy, cervical region 02/22/2023   Unilateral primary osteoarthritis, right knee 02/22/2023   Bilateral hand pain 01/27/2023   Elbow pain, chronic 01/27/2023   Carpal tunnel syndrome on both sides 01/27/2023   Plantar fasciitis 01/27/2023   Bad memory 01/27/2023   Hematuria, microscopic 11/16/2022   Encounter for general adult medical examination with abnormal findings 09/27/2022   Need for immunization against influenza 09/27/2022   Pap smear for cervical cancer screening 09/27/2022   Lumbar pain with radiation down left leg 08/16/2022   REFERRING PROVIDER: Eldred Manges, MD   REFERRING DIAG: Neck pain   THERAPY DIAG:  Radiculopathy, cervical region  Rationale for Evaluation and Treatment: Rehabilitation  ONSET DATE: 1 year ago   SUBJECTIVE:                                                                                                                                                                                                         SUBJECTIVE STATEMENT: Pt arrives for today's treatment session reporting 4/10 neck and hand pain.  Reports pain got up to 7/10 last night.  Hand dominance: Right  PERTINENT HISTORY:  Osteoarthritis, chronic low back  pain, and memory impairments  PAIN:  Are you having pain? Yes: NPRS scale: 4/10 Pain location: neck and both hands Pain description: numb and aching intermittently Aggravating factors: moving, work, lifting, carrying, sleeping Relieving factors: medication  PRECAUTIONS: None  WEIGHT BEARING RESTRICTIONS: No  FALLS:  Has patient fallen in last 6 months? Yes. 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  POSTURE: forward head  PALPATION: Familiar pain reproduced by palpation to cervical paraspinals   CERVICAL ROM:   Active ROM A/PROM (deg) eval  Flexion 44  Extension 28  Right lateral flexion 16  Left lateral flexion 28; soreness   Right rotation 45  Left rotation 41   (Blank rows = not tested)  UPPER EXTREMITY ROM: limited by right shoulder pain  UPPER EXTREMITY MMT:  MMT Right eval Left eval  Shoulder flexion 3+/5; limited by right shoulder pain  3+/5  Shoulder extension    Shoulder abduction    Shoulder adduction    Shoulder extension    Shoulder internal rotation    Shoulder external rotation    Middle trapezius    Lower trapezius    Elbow flexion 3+/5 3+/5  Elbow extension 3+/5 3+/5  Wrist flexion    Wrist extension    Wrist ulnar deviation    Wrist radial deviation    Wrist pronation    Wrist supination    Grip strength 5 10   (Blank rows = not tested)  CERVICAL SPECIAL TESTS:  Spurling's test: Negative, Distraction test: Positive, and Sharp pursor's test:  Negative  TODAY'S TREATMENT:                                                                                                                              DATE:                                    5/2 EXERCISE LOG  Exercise Repetitions and Resistance Comments  Chin Tucks x20 reps   Shoulder Shrugs x20 reps   Scap Retractions x20 reps   Rows Yellow x20 reps   Extension Yellow x20 reps    Blank cell = exercise not performed today   Manual Therapy Soft Tissue Mobilization: neck and shoulders, STW/M to bil cervical paraspinals and bil upper traps to decrease pain and tone   Modalities  Date:  Unattended Estim: Cervical, IFC 80-150 HZ , 10 mins, Pain Hot Pack: Cervical, 10 mins, Pain and Tone   PATIENT EDUCATION:  Education details: Plan of care, healing, prognosis, and goals for therapy Person educated: Patient Education method: Explanation Education comprehension: verbalized understanding  HOME EXERCISE PROGRAM:   ASSESSMENT:  CLINICAL IMPRESSION: Pt arrives for today's treatment session reporting 4/10 neck and shoulder pain.  Pt instructed in seated cervical and shoulder exercises to increase strength and function.  Pt requiring min cues for proper technique and posture with all newly added exercises.  STW/M performed to bil cervical paraspinals to decrease  pain and tone.  Normal responses to estim and MH noted upon removal.  Pt reported 3/10 neck pain upon completion of today's treatment session.   OBJECTIVE IMPAIRMENTS: decreased activity tolerance, decreased mobility, decreased ROM, decreased strength, hypomobility, impaired sensation, impaired tone, impaired UE functional use, postural dysfunction, and pain.   ACTIVITY LIMITATIONS: carrying, lifting, sleeping, and reach over head  PARTICIPATION LIMITATIONS: meal prep, cleaning, laundry, shopping, community activity, and yard work  PERSONAL FACTORS: Time since onset of injury/illness/exacerbation and 3+ comorbidities:  Osteoarthritis, chronic low back pain, and memory impairments  are also affecting patient's functional outcome.   REHAB POTENTIAL: Fair    CLINICAL DECISION MAKING: Evolving/moderate complexity  EVALUATION COMPLEXITY: Moderate   GOALS: Goals reviewed with patient? Yes  LONG TERM GOALS: Target date: 04/03/23  Patient will be independent with her HEP. Baseline:  Goal status: INITIAL  2.  Patient will be able to complete her daily activities without her familiar pain exceeding 6/10. Baseline:  Goal status: INITIAL  3.  Patient will be able to demonstrate at least 50 degrees of cervical rotation bilaterally for improved awareness of her surroundings. Baseline:  Goal status: INITIAL  4.  Patient will improve her grip strength to at least 15 pounds bilaterally for improved function grasping items. Baseline:  Goal status: INITIAL  PLAN:  PT FREQUENCY: 2x/week  PT DURATION: 3 weeks  PLANNED INTERVENTIONS: Therapeutic exercises, Therapeutic activity, Neuromuscular re-education, Patient/Family education, Self Care, Joint mobilization, Dry Needling, Electrical stimulation, Spinal mobilization, Cryotherapy, Moist heat, Traction, Ultrasound, Manual therapy, and Re-evaluation  PLAN FOR NEXT SESSION: Chin tucks, isometrics, scapular retractions, and manual therapy   Newman Pies, PTA 03/16/2023, 10:48 AM

## 2023-03-20 ENCOUNTER — Ambulatory Visit: Payer: BC Managed Care – PPO

## 2023-03-20 DIAGNOSIS — M5412 Radiculopathy, cervical region: Secondary | ICD-10-CM | POA: Diagnosis not present

## 2023-03-20 NOTE — Therapy (Signed)
OUTPATIENT PHYSICAL THERAPY CERVICAL TREATMENT   Patient Name: Marissa Coleman MRN: [id number] DOB:May 13, 1966, 57 y.o., female Today's Date: 03/20/2023  END OF SESSION:  PT End of Session - 03/20/23 1603     Visit Number 3    Number of Visits 6    Date for PT Re-Evaluation 04/13/23    PT Start Time 1600    PT Stop Time 1643    PT Time Calculation (min) 43 min    Activity Tolerance Patient tolerated treatment well    Behavior During Therapy [facility] for tasks assessed/performed             History reviewed. No pertinent past medical history. Past Surgical History:  Procedure Laterality Date   BREAST CYST ASPIRATION Right 04/05/2016   Patient Active Problem List   Diagnosis Date Noted   Other spondylosis with radiculopathy, cervical region 02/22/2023   Unilateral primary osteoarthritis, right knee 02/22/2023   Bilateral hand pain 01/27/2023   Elbow pain, chronic 01/27/2023   Carpal tunnel syndrome on both sides 01/27/2023   Plantar fasciitis 01/27/2023   Bad memory 01/27/2023   Hematuria, microscopic 11/16/2022   Encounter for general adult medical examination with abnormal findings 09/27/2022   Need for immunization against influenza 09/27/2022   Pap smear for cervical cancer screening 09/27/2022   Lumbar pain with radiation down left leg 08/16/2022   REFERRING PROVIDER: Eldred Manges, MD   REFERRING DIAG: Neck pain   THERAPY DIAG:  Radiculopathy, cervical region  Rationale for Evaluation and Treatment: Rehabilitation  ONSET DATE: 1 year ago   SUBJECTIVE:                                                                                                                                                                                                         SUBJECTIVE STATEMENT: Patient reports that her neck has been hurting all day.  Hand dominance: Right  PERTINENT HISTORY:  Osteoarthritis, chronic low back pain, and memory impairments  PAIN:  Are you having pain?  Yes: NPRS scale: 6/10 Pain location: neck and both hands Pain description: numb and aching intermittently Aggravating factors: moving, work, lifting, carrying, sleeping Relieving factors: medication  PRECAUTIONS: None  WEIGHT BEARING RESTRICTIONS: No  FALLS:  Has patient fallen in last 6 months? Yes. 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  POSTURE: forward head  PALPATION: Familiar pain reproduced by palpation to cervical paraspinals   CERVICAL ROM:   Active ROM A/PROM (deg) eval  Flexion 44  Extension 28  Right lateral flexion 16  Left lateral flexion 28; soreness   Right rotation 45  Left rotation 41   (Blank rows = not tested)  UPPER EXTREMITY ROM: limited by right shoulder pain  UPPER EXTREMITY MMT:  MMT Right eval Left eval  Shoulder flexion 3+/5; limited by right shoulder pain  3+/5  Shoulder extension    Shoulder abduction    Shoulder adduction    Shoulder extension    Shoulder internal rotation    Shoulder external rotation    Middle trapezius    Lower trapezius    Elbow flexion 3+/5 3+/5  Elbow extension 3+/5 3+/5  Wrist flexion    Wrist extension    Wrist ulnar deviation    Wrist radial deviation    Wrist pronation    Wrist supination    Grip strength 5 10   (Blank rows = not tested)  CERVICAL SPECIAL TESTS:  Spurling's test: Negative, Distraction test: Positive, and Sharp pursor's test: Negative  TODAY'S TREATMENT:                                                                                                                               DATE:                                     5/7 EXERCISE LOG  Exercise Repetitions and Resistance Comments  Scapular retraction 2 minutes   Shoulder shrug isometric  30 reps w 5 second hold                Blank cell = exercise not performed today  Manual Therapy Soft Tissue Mobilization: bilateral upper trapezius and cervical paraspinals, for reduced pain and tone Joint Mobilizations: C4-7, grade I-IV CPA"s   Modalities: no redness or adverse reaction to today's modalities  Date:  Unattended Estim: Cervical, IFC @ 80-150 Hz w/ 40% scan, 10 mins, Pain and Tone Hot Pack: Cervical, 10 mins, Pain and Tone                                   5/2 EXERCISE LOG  Exercise Repetitions and Resistance Comments  Chin Tucks x20 reps   Shoulder Shrugs x20 reps   Scap Retractions x20 reps   Rows Yellow x20 reps   Extension Yellow x20 reps    Blank cell = exercise not performed today  Manual Therapy Soft Tissue Mobilization: neck and shoulders, STW/M to bil cervical paraspinals and bil upper traps to decrease pain and tone   Modalities  Date:  Unattended Estim: Cervical, IFC 80-150 HZ , 10 mins, Pain Hot Pack: Cervical, 10 mins, Pain and Tone   PATIENT EDUCATION:  Education details: Plan of care, healing, prognosis, and goals for therapy Person educated: Patient Education method: Explanation Education comprehension: verbalized understanding  HOME EXERCISE PROGRAM:   ASSESSMENT:  CLINICAL IMPRESSION: Patient presented to treatment reporting elevated cervical pain.  Treatment focused on manual therapy for reduced pain and tone to her cervical musculature.  Cervical joint mobilizations and soft tissue mobilization to the surrounding musculature was able to slightly reduce her familiar symptoms.  She exhibited increased tone and trigger points throughout her upper trapezius bilaterally.  She  reported feeling slightly better upon the conclusion of treatment.  Recommend that she continue with skilled physical therapy to address her remaining impairments to maximize her functional mobility.  OBJECTIVE IMPAIRMENTS: decreased activity tolerance, decreased mobility, decreased ROM, decreased strength, hypomobility, impaired sensation, impaired tone, impaired UE functional use, postural dysfunction, and pain.   ACTIVITY LIMITATIONS: carrying, lifting, sleeping, and reach over head  PARTICIPATION LIMITATIONS: meal prep, cleaning, laundry, shopping, community activity, and yard work  PERSONAL FACTORS: Time since onset of injury/illness/exacerbation and 3+ comorbidities: Osteoarthritis, chronic low back pain, and memory impairments  are also affecting patient's functional outcome.   REHAB POTENTIAL: Fair    CLINICAL DECISION MAKING: Evolving/moderate complexity  EVALUATION COMPLEXITY: Moderate   GOALS: Goals reviewed with patient? Yes  LONG TERM GOALS: Target date: 04/03/23  Patient will be independent with her HEP. Baseline:  Goal status: INITIAL  2.  Patient will be able to complete her daily activities without her familiar pain exceeding 6/10. Baseline:  Goal status: INITIAL  3.  Patient will be able to demonstrate at least 50 degrees of cervical rotation bilaterally for improved awareness of her surroundings. Baseline:  Goal status: INITIAL  4.  Patient will improve her grip strength to at least 15 pounds bilaterally for improved function grasping items. Baseline:  Goal status: INITIAL  PLAN:  PT FREQUENCY: 2x/week  PT DURATION: 3 weeks  PLANNED INTERVENTIONS: Therapeutic exercises, Therapeutic activity, Neuromuscular re-education, Patient/Family education, Self Care, Joint mobilization, Dry Needling, Electrical stimulation, Spinal mobilization, Cryotherapy, Moist heat, Traction, Ultrasound, Manual therapy, and Re-evaluation  PLAN FOR NEXT SESSION: Chin tucks,  isometrics, scapular retractions, and manual therapy   Granville Lewis, PT 03/20/2023, 5:24 PM

## 2023-03-21 ENCOUNTER — Ambulatory Visit: Payer: BC Managed Care – PPO

## 2023-03-21 DIAGNOSIS — M5412 Radiculopathy, cervical region: Secondary | ICD-10-CM | POA: Diagnosis not present

## 2023-03-21 NOTE — Therapy (Signed)
OUTPATIENT PHYSICAL THERAPY CERVICAL TREATMENT   Patient Name: Marissa Coleman MRN: [id number] DOB:[birth year], 57 y.o., female Today's Date: 03/21/2023  END OF SESSION:  PT End of Session - 03/21/23 1350     Visit Number 4    Number of Visits 6    Date for PT Re-Evaluation 04/13/23    PT Start Time 1349    PT Stop Time 1434    PT Time Calculation (min) 45 min    Activity Tolerance Patient tolerated treatment well    Behavior During Therapy [facility] for tasks assessed/performed             History reviewed. No pertinent past medical history. Past Surgical History:  Procedure Laterality Date   BREAST CYST ASPIRATION Right 04/05/2016   Patient Active Problem List   Diagnosis Date Noted   Other spondylosis with radiculopathy, cervical region 02/22/2023   Unilateral primary osteoarthritis, right knee 02/22/2023   Bilateral hand pain 01/27/2023   Elbow pain, chronic 01/27/2023   Carpal tunnel syndrome on both sides 01/27/2023   Plantar fasciitis 01/27/2023   Bad memory 01/27/2023   Hematuria, microscopic 11/16/2022   Encounter for general adult medical examination with abnormal findings 09/27/2022   Need for immunization against influenza 09/27/2022   Pap smear for cervical cancer screening 09/27/2022   Lumbar pain with radiation down left leg 08/16/2022   REFERRING PROVIDER: Eldred Manges, MD   REFERRING DIAG: Neck pain   THERAPY DIAG:  Radiculopathy, cervical region  Rationale for Evaluation and Treatment: Rehabilitation  ONSET DATE: 1 year ago   SUBJECTIVE:                                                                                                                                                                                                         SUBJECTIVE STATEMENT: Patient reports that she had to take her pain medication after her last appointment. She is not hurting as bad today.  Hand dominance: Right  PERTINENT HISTORY:  Osteoarthritis, chronic low back  pain, and memory impairments  PAIN:  Are you having pain? Yes: NPRS scale: 5/10 Pain location: neck and both hands Pain description: numb and aching intermittently Aggravating factors: moving, work, lifting, carrying, sleeping Relieving factors: medication  PRECAUTIONS: None  WEIGHT BEARING RESTRICTIONS: No  FALLS:  Has patient fallen in last 6 months? Yes. 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  POSTURE: forward head  PALPATION: Familiar pain reproduced by palpation to cervical paraspinals   CERVICAL ROM:   Active ROM A/PROM (deg) eval  Flexion 44  Extension 28  Right lateral flexion 16  Left lateral flexion 28; soreness   Right rotation 45  Left rotation 41   (Blank rows = not tested)  UPPER EXTREMITY ROM: limited by right shoulder pain  UPPER EXTREMITY MMT:  MMT Right eval Left eval  Shoulder flexion 3+/5; limited by right shoulder pain  3+/5  Shoulder extension    Shoulder abduction    Shoulder adduction    Shoulder extension    Shoulder internal rotation    Shoulder external rotation    Middle trapezius    Lower trapezius    Elbow flexion 3+/5 3+/5  Elbow extension 3+/5 3+/5  Wrist flexion    Wrist extension    Wrist ulnar deviation    Wrist radial deviation    Wrist pronation    Wrist supination    Grip strength 5 10   (Blank rows = not tested)  CERVICAL SPECIAL TESTS:  Spurling's test: Negative, Distraction test: Positive, and Sharp pursor's test:  Negative  TODAY'S TREATMENT:                                                                                                                              DATE:                                     5/8 EXERCISE LOG  Exercise Repetitions and Resistance Comments  Resisted row  Yellow t-band x 2 minutes   Resisted pull down  Yellow t-band x 2.5 minutes   Ball roll out  2 minutes For scapulothoracic mobility  Bilateral ER  Yellow t-band x 2 minutes        Blank cell = exercise not performed today  Manual Therapy Soft Tissue Mobilization: bilateral upper trapezius and cervical paraspinals, for reduced pain and tone Joint Mobilizations: C4-T2, grade I-III   Modalities: no redness or adverse reaction to today's modalities  Date:  Unattended Estim: Cervical, IFC @ 80-150 Hz w/ 40% scan, 15 mins, Pain and Tone Hot Pack: Cervical, 15 mins, Pain and Tone                                   5/7 EXERCISE LOG  Exercise Repetitions and Resistance Comments  Scapular retraction 2 minutes   Shoulder shrug isometric  30 reps w 5 second  hold                Blank cell = exercise not performed today  Manual Therapy Soft Tissue Mobilization: bilateral upper trapezius and cervical paraspinals, for reduced pain and tone Joint Mobilizations: C4-7, grade I-IV CPA"s   Modalities: no redness or adverse reaction to today's modalities  Date:  Unattended Estim: Cervical, IFC @ 80-150 Hz w/ 40% scan, 10 mins, Pain and Tone Hot Pack: Cervical, 10 mins, Pain and Tone                                   5/2 EXERCISE LOG  Exercise Repetitions and Resistance Comments  Chin Tucks x20 reps   Shoulder Shrugs x20 reps   Scap Retractions x20 reps   Rows Yellow x20 reps   Extension Yellow x20 reps    Blank cell = exercise not performed today   Manual Therapy Soft Tissue Mobilization: neck and shoulders, STW/M to bil cervical paraspinals and bil upper traps to decrease pain and tone   Modalities  Date:   Unattended Estim: Cervical, IFC 80-150 HZ , 10 mins, Pain Hot Pack: Cervical, 10 mins, Pain and Tone   PATIENT EDUCATION:  Education details: Plan of care, healing, prognosis, and goals for therapy Person educated: Patient Education method: Explanation Education comprehension: verbalized understanding  HOME EXERCISE PROGRAM:   ASSESSMENT:  CLINICAL IMPRESSION: Patient was introduced to multiple new interventions for improved scapulothoracic mobility and strength needed for improved function with her daily activities. She required minimal cueing with today's new interventions for proper exercise performance. Manual therapy focused on cervical joint mobilizations and soft tissue mobilization to the surrounding musculature with minimal effectiveness. She reported feeling slightly better as her pain was a 4/10 upon the conclusion of treatment. She continues to require skilled physical therapy to address her remaining impairments to maximize her functional mobility.   OBJECTIVE IMPAIRMENTS: decreased activity tolerance, decreased mobility, decreased ROM, decreased strength, hypomobility, impaired sensation, impaired tone, impaired UE functional use, postural dysfunction, and pain.   ACTIVITY LIMITATIONS: carrying, lifting, sleeping, and reach over head  PARTICIPATION LIMITATIONS: meal prep, cleaning, laundry, shopping, community activity, and yard work  PERSONAL FACTORS: Time since onset of injury/illness/exacerbation and 3+ comorbidities: Osteoarthritis, chronic low back pain, and memory impairments  are also affecting patient's functional outcome.   REHAB POTENTIAL: Fair    CLINICAL DECISION MAKING: Evolving/moderate complexity  EVALUATION COMPLEXITY: Moderate   GOALS: Goals reviewed with patient? Yes  LONG TERM GOALS: Target date: 04/03/23  Patient will be independent with her HEP. Baseline:  Goal status: INITIAL  2.  Patient will be able to complete her daily activities  without her familiar pain exceeding 6/10. Baseline:  Goal status: INITIAL  3.  Patient will be able to demonstrate at least 50 degrees of cervical rotation bilaterally for improved awareness of her surroundings. Baseline:  Goal status: INITIAL  4.  Patient will improve her grip strength to at least 15 pounds bilaterally for improved function grasping items. Baseline:  Goal status: INITIAL  PLAN:  PT FREQUENCY: 2x/week  PT DURATION: 3 weeks  PLANNED INTERVENTIONS: Therapeutic exercises, Therapeutic activity, Neuromuscular re-education, Patient/Family education, Self Care, Joint mobilization, Dry Needling, Electrical stimulation, Spinal mobilization, Cryotherapy, Moist heat, Traction, Ultrasound, Manual therapy, and Re-evaluation  PLAN FOR NEXT SESSION: Chin tucks, isometrics, scapular retractions, and manual therapy   Granville Lewis, PT 03/21/2023, 2:36 PM

## 2023-03-27 ENCOUNTER — Ambulatory Visit: Payer: BC Managed Care – PPO

## 2023-03-27 DIAGNOSIS — M5412 Radiculopathy, cervical region: Secondary | ICD-10-CM | POA: Diagnosis not present

## 2023-03-27 NOTE — Therapy (Signed)
OUTPATIENT PHYSICAL THERAPY CERVICAL TREATMENT   Patient Name: Marissa Coleman MRN: [id number] DOB:Dec 05, 1965, 57 y.o., female Today's Date: 03/27/2023  END OF SESSION:  PT End of Session - 03/27/23 1603     Visit Number 5    Number of Visits 6    Date for PT Re-Evaluation 04/13/23    PT Start Time 1601    PT Stop Time 1631    PT Time Calculation (min) 30 min    Activity Tolerance Patient tolerated treatment well    Behavior During Therapy [facility] for tasks assessed/performed             History reviewed. No pertinent past medical history. Past Surgical History:  Procedure Laterality Date   BREAST CYST ASPIRATION Right 04/05/2016   Patient Active Problem List   Diagnosis Date Noted   Other spondylosis with radiculopathy, cervical region 02/22/2023   Unilateral primary osteoarthritis, right knee 02/22/2023   Bilateral hand pain 01/27/2023   Elbow pain, chronic 01/27/2023   Carpal tunnel syndrome on both sides 01/27/2023   Plantar fasciitis 01/27/2023   Bad memory 01/27/2023   Hematuria, microscopic 11/16/2022   Encounter for general adult medical examination with abnormal findings 09/27/2022   Need for immunization against influenza 09/27/2022   Pap smear for cervical cancer screening 09/27/2022   Lumbar pain with radiation down left leg 08/16/2022   REFERRING PROVIDER: Eldred Manges, MD   REFERRING DIAG: Neck pain   THERAPY DIAG:  Radiculopathy, cervical region  Rationale for Evaluation and Treatment: Rehabilitation  ONSET DATE: 1 year ago   SUBJECTIVE:                                                                                                                                                                                                         SUBJECTIVE STATEMENT: Patient reports that she is hurting today and she has a headache. She notes that the weather is not helping.  Hand dominance: Right  PERTINENT HISTORY:  Osteoarthritis, chronic low back pain,  and memory impairments  PAIN:  Are you having pain? Yes: NPRS scale: 5/10 Pain location: neck and both hands Pain description: numb and aching intermittently Aggravating factors: moving, work, lifting, carrying, sleeping Relieving factors: medication  PRECAUTIONS: None  WEIGHT BEARING RESTRICTIONS: No  FALLS:  Has patient fallen in last 6 months? Yes. 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  POSTURE: forward head  PALPATION: Familiar pain reproduced by palpation to cervical paraspinals   CERVICAL ROM:   Active ROM A/PROM (deg) eval  Flexion 44  Extension 28  Right lateral flexion 16  Left lateral flexion 28; soreness   Right rotation 45  Left rotation 41   (Blank rows = not tested)  UPPER EXTREMITY ROM: limited by right shoulder pain  UPPER EXTREMITY MMT:  MMT Right eval Left eval  Shoulder flexion 3+/5; limited by right shoulder pain  3+/5  Shoulder extension    Shoulder abduction    Shoulder adduction    Shoulder extension    Shoulder internal rotation    Shoulder external rotation    Middle trapezius    Lower trapezius    Elbow flexion 3+/5 3+/5  Elbow extension 3+/5 3+/5  Wrist flexion    Wrist extension    Wrist ulnar deviation    Wrist radial deviation    Wrist pronation    Wrist supination    Grip strength 5 10   (Blank rows = not tested)  CERVICAL SPECIAL TESTS:  Spurling's test: Negative, Distraction test: Positive, and Sharp pursor's test:  Negative  TODAY'S TREATMENT:                                                                                                                              DATE:   03/27/23 Manual Therapy Soft Tissue Mobilization: bilateral suboccipitals and cervical paraspinals, for reduced pain and tone   Modalities  Date:  Unattended Estim: Cervical, IFC @ 80-150 Hz w/ 40% scan, 10 mins, Pain and Tone Hot Pack: Cervical, 10 mins, Pain and Tone                                   5/8 EXERCISE LOG  Exercise Repetitions and Resistance Comments  Resisted row  Yellow t-band x 2 minutes   Resisted pull down  Yellow t-band x 2.5 minutes   Ball roll out  2 minutes For scapulothoracic mobility  Bilateral ER  Yellow t-band x 2 minutes        Blank cell = exercise not performed today  Manual Therapy Soft Tissue Mobilization: bilateral upper trapezius and cervical paraspinals, for reduced pain and tone Joint Mobilizations: C4-T2, grade I-III   Modalities: no redness or adverse reaction to today's modalities  Date:  Unattended Estim: Cervical, IFC @ 80-150 Hz w/ 40% scan, 15 mins, Pain and Tone Hot Pack: Cervical, 15 mins, Pain and Tone  5/7 EXERCISE LOG  Exercise Repetitions and Resistance Comments  Scapular retraction 2 minutes   Shoulder shrug isometric  30 reps w 5 second hold                Blank cell = exercise not performed today  Manual Therapy Soft Tissue Mobilization: bilateral upper trapezius and cervical paraspinals, for reduced pain and tone Joint Mobilizations: C4-7, grade I-IV CPA"s   Modalities: no redness or adverse reaction to today's modalities  Date:  Unattended Estim: Cervical, IFC @ 80-150 Hz w/ 40% scan, 10 mins, Pain and Tone Hot Pack: Cervical, 10 mins, Pain and Tone   PATIENT EDUCATION:  Education details: Plan of care, healing, prognosis, and goals for therapy Person educated: Patient Education method: Explanation Education  comprehension: verbalized understanding  HOME EXERCISE PROGRAM:   ASSESSMENT:  CLINICAL IMPRESSION: Treatment focused on manual therapy and modalities for reduced pain and tone. She was educated on the proper use of electrical stimulation at home to safely manage her symptoms. She reported understanding. Soft tissue mobilization to her suboccipitals and cervical paraspinals were able to slightly reduce her familiar symptoms. She reported feeling alright upon the conclusion of treatment. She continues to require skilled physical therapy to address her remaining impairments to maximize her functional mobility.   OBJECTIVE IMPAIRMENTS: decreased activity tolerance, decreased mobility, decreased ROM, decreased strength, hypomobility, impaired sensation, impaired tone, impaired UE functional use, postural dysfunction, and pain.   ACTIVITY LIMITATIONS: carrying, lifting, sleeping, and reach over head  PARTICIPATION LIMITATIONS: meal prep, cleaning, laundry, shopping, community activity, and yard work  PERSONAL FACTORS: Time since onset of injury/illness/exacerbation and 3+ comorbidities: Osteoarthritis, chronic low back pain, and memory impairments  are also affecting patient's functional outcome.   REHAB POTENTIAL: Fair    CLINICAL DECISION MAKING: Evolving/moderate complexity  EVALUATION COMPLEXITY: Moderate   GOALS: Goals reviewed with patient? Yes  LONG TERM GOALS: Target date: 04/03/23  Patient will be independent with her HEP. Baseline:  Goal status: INITIAL  2.  Patient will be able to complete her daily activities without her familiar pain exceeding 6/10. Baseline:  Goal status: INITIAL  3.  Patient will be able to demonstrate at least 50 degrees of cervical rotation bilaterally for improved awareness of her surroundings. Baseline:  Goal status: INITIAL  4.  Patient will improve her grip strength to at least 15 pounds bilaterally for improved function grasping  items. Baseline:  Goal status: INITIAL  PLAN:  PT FREQUENCY: 2x/week  PT DURATION: 3 weeks  PLANNED INTERVENTIONS: Therapeutic exercises, Therapeutic activity, Neuromuscular re-education, Patient/Family education, Self Care, Joint mobilization, Dry Needling, Electrical stimulation, Spinal mobilization, Cryotherapy, Moist heat, Traction, Ultrasound, Manual therapy, and Re-evaluation  PLAN FOR NEXT SESSION: check goals, review HEP, and discharge    Granville Lewis, PT 03/27/2023, 4:42 PM

## 2023-03-29 ENCOUNTER — Telehealth: Payer: Self-pay | Admitting: Radiology

## 2023-03-29 ENCOUNTER — Ambulatory Visit: Payer: BC Managed Care – PPO

## 2023-03-29 DIAGNOSIS — M5412 Radiculopathy, cervical region: Secondary | ICD-10-CM

## 2023-03-29 DIAGNOSIS — M542 Cervicalgia: Secondary | ICD-10-CM

## 2023-03-29 NOTE — Telephone Encounter (Signed)
Patient requests MRI cervical spine. This was requested at last visit, however, denied because patient needed PT prior. She has been attending PT.   Please advise.   OK to resubmit?  CB [phone]

## 2023-03-29 NOTE — Therapy (Addendum)
OUTPATIENT PHYSICAL THERAPY CERVICAL TREATMENT   Patient Name: Marissa Coleman MRN: [id number] DOB:[birth year], 57 y.o., female Today's Date: 03/29/2023  END OF SESSION:  PT End of Session - 03/29/23 1607     Visit Number 6    Number of Visits 6    Date for PT Re-Evaluation 04/13/23    PT Start Time 1555    PT Stop Time 1625    PT Time Calculation (min) 30 min    Activity Tolerance Patient tolerated treatment well    Behavior During Therapy [facility] for tasks assessed/performed             History reviewed. No pertinent past medical history. Past Surgical History:  Procedure Laterality Date   BREAST CYST ASPIRATION Right 04/05/2016   Patient Active Problem List   Diagnosis Date Noted   Other spondylosis with radiculopathy, cervical region 02/22/2023   Unilateral primary osteoarthritis, right knee 02/22/2023   Bilateral hand pain 01/27/2023   Elbow pain, chronic 01/27/2023   Carpal tunnel syndrome on both sides 01/27/2023   Plantar fasciitis 01/27/2023   Bad memory 01/27/2023   Hematuria, microscopic 11/16/2022   Encounter for general adult medical examination with abnormal findings 09/27/2022   Need for immunization against influenza 09/27/2022   Pap smear for cervical cancer screening 09/27/2022   Lumbar pain with radiation down left leg 08/16/2022   REFERRING PROVIDER: Eldred Manges, MD   REFERRING DIAG: Neck pain   THERAPY DIAG:  Radiculopathy, cervical region  Rationale for Evaluation and Treatment: Rehabilitation  ONSET DATE: 1 year ago   SUBJECTIVE:                                                                                                                                                                                                         SUBJECTIVE STATEMENT: Pt reports 4/10 neck and hand pain today.  Pt reports contacting MD about MRI, but no response as of yet.  Hand dominance: Right  PERTINENT HISTORY:  Osteoarthritis, chronic low back pain, and  memory impairments  PAIN:  Are you having pain? Yes: NPRS scale: 4/10 Pain location: neck and both hands Pain description: numb and aching intermittently Aggravating factors: moving, work, lifting, carrying, sleeping Relieving factors: medication  PRECAUTIONS: None  WEIGHT BEARING RESTRICTIONS: No  FALLS:  Has patient fallen in last 6 months? Yes. 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  POSTURE: forward head  PALPATION: Familiar pain reproduced by palpation to cervical paraspinals   CERVICAL ROM:   Active ROM A/PROM (deg) eval  Flexion 44  Extension 28  Right lateral flexion 16  Left lateral flexion 28; soreness   Right rotation 45  Left rotation 41   (Blank rows = not tested)  UPPER EXTREMITY ROM: limited by right shoulder pain  UPPER EXTREMITY MMT:  MMT Right eval Left eval  Shoulder flexion 3+/5; limited by right shoulder pain  3+/5  Shoulder extension    Shoulder abduction    Shoulder adduction    Shoulder extension    Shoulder internal rotation    Shoulder external rotation    Middle trapezius    Lower trapezius    Elbow flexion 3+/5 3+/5  Elbow extension 3+/5 3+/5  Wrist flexion    Wrist extension    Wrist ulnar deviation    Wrist radial deviation    Wrist pronation    Wrist supination    Grip strength 5 10   (Blank rows = not tested)  CERVICAL SPECIAL TESTS:  Spurling's test: Negative, Distraction test: Positive, and Sharp pursor's test:  Negative  TODAY'S TREATMENT:                                                                                                                              DATE:    03/29/23 Modalities  Date:  Unattended Estim: Cervical, IFC 80-150 Hz, 20 mins, Pain Hot Pack: Cervical, 20 mins, Pain and Tone   03/27/23 Manual Therapy Soft Tissue Mobilization: bilateral suboccipitals and cervical paraspinals, for reduced pain and tone   Modalities  Date:  Unattended Estim: Cervical, IFC @ 80-150 Hz w/ 40% scan, 10 mins, Pain and Tone Hot Pack: Cervical, 10 mins, Pain and Tone                            PATIENT EDUCATION:  Education details: Plan of care, healing, prognosis, and goals for therapy Person educated: Patient Education method: Explanation Education comprehension: verbalized understanding  HOME EXERCISE PROGRAM:   ASSESSMENT:  CLINICAL IMPRESSION: Pt arrives for today's treatment session reporting 4/10 neck and hand pain.  Pt has made minimal progress towards her goals at this time.  Pt declined performance of exercises today, requesting estm and MH only.  Pt encouraged to continue performance of HEP as originally instructed.  Normal responses to all modalities noted upon removal.  Pt ready for discharge at this time.   PHYSICAL THERAPY DISCHARGE SUMMARY  Visits from Start of Care: 6  Current functional level related to goals /  functional outcomes: Patient was unable to meet her goals for skilled physical therapy.    Remaining deficits: Pain, AROM, and muscular strength   Education / Equipment: HEP   Patient agrees to discharge. Patient goals were partially met. Patient is being discharged due to maximized rehab potential.   Candi Leash, PT, DPT    OBJECTIVE IMPAIRMENTS: decreased activity tolerance, decreased mobility, decreased ROM, decreased strength, hypomobility, impaired sensation, impaired tone, impaired UE functional use, postural dysfunction, and pain.   ACTIVITY  LIMITATIONS: carrying, lifting, sleeping, and reach over head  PARTICIPATION LIMITATIONS: meal prep, cleaning, laundry, shopping, community activity, and yard work  PERSONAL FACTORS: Time since onset of injury/illness/exacerbation and 3+ comorbidities: Osteoarthritis, chronic low back pain, and memory impairments  are also affecting patient's functional outcome.   REHAB POTENTIAL: Fair    CLINICAL DECISION MAKING: Evolving/moderate complexity  EVALUATION COMPLEXITY: Moderate   GOALS: Goals reviewed with patient? Yes  LONG TERM GOALS: Target date: 04/03/23  Patient will be independent with her HEP. Baseline:  Goal status: MET  2.  Patient will be able to complete her daily activities without her familiar pain exceeding 6/10. Baseline: 5/16: depends on the day Goal status: IN PROGRESS  3.  Patient will be able to demonstrate at least 50 degrees of cervical rotation bilaterally for improved awareness of her surroundings. Baseline: 5/16: 48 degrees to right, 45 degrees to left Goal status: IN PROGRESS  4.  Patient will improve her grip strength to at least 15 pounds bilaterally for improved function grasping items. Baseline: 5/16: right 5#, left 12# Goal status: IN PROGRESS  PLAN:  PT FREQUENCY: 2x/week  PT DURATION: 3 weeks  PLANNED INTERVENTIONS: Therapeutic exercises, Therapeutic activity, Neuromuscular re-education, Patient/Family education, Self Care, Joint mobilization, Dry Needling, Electrical stimulation, Spinal mobilization, Cryotherapy, Moist heat, Traction, Ultrasound, Manual therapy, and Re-evaluation  PLAN FOR NEXT SESSION: check goals, review HEP, and discharge    Newman Pies, PTA 03/29/2023, 4:26 PM

## 2023-03-30 NOTE — Addendum Note (Signed)
Addended by: Rogers Seeds on: 03/30/2023 09:15 AM   Modules accepted: Orders

## 2023-03-30 NOTE — Telephone Encounter (Signed)
Order entered. Sent to [place] office to get prior auth and schedule.

## 2023-03-30 NOTE — Telephone Encounter (Signed)
I left voicemail for patient advising order has been put in and we will work on authorization. Also advised she will need follow up appt to review.

## 2023-04-26 DIAGNOSIS — M47813 Spondylosis without myelopathy or radiculopathy, cervicothoracic region: Secondary | ICD-10-CM | POA: Diagnosis not present

## 2023-04-26 DIAGNOSIS — M542 Cervicalgia: Secondary | ICD-10-CM | POA: Diagnosis not present

## 2023-04-26 DIAGNOSIS — M50223 Other cervical disc displacement at C6-C7 level: Secondary | ICD-10-CM | POA: Diagnosis not present

## 2023-04-27 ENCOUNTER — Ambulatory Visit: Payer: BC Managed Care – PPO | Admitting: Family Medicine

## 2023-05-03 ENCOUNTER — Ambulatory Visit (INDEPENDENT_AMBULATORY_CARE_PROVIDER_SITE_OTHER): Payer: BC Managed Care – PPO | Admitting: Orthopaedic Surgery

## 2023-05-03 ENCOUNTER — Encounter: Payer: Self-pay | Admitting: Orthopaedic Surgery

## 2023-05-03 VITALS — Ht 66.0 in | Wt 190.0 lb

## 2023-05-03 DIAGNOSIS — M4722 Other spondylosis with radiculopathy, cervical region: Secondary | ICD-10-CM

## 2023-05-03 NOTE — Progress Notes (Signed)
Office Visit Note   Patient: Marissa Coleman           Date of Birth: 06-19-1966           MRN: [id number] Visit Date: 05/03/2023              Requested by: Gilmore Laroche, FNP [address] [place],  [place] [address] PCP: Gilmore Laroche, FNP   Assessment & Plan: Visit Diagnoses:  1. Other spondylosis with radiculopathy, cervical region     Plan: We reviewed MRI scan and discussed pathophysiology.  At this point no surgery is indicated she does have some foraminal narrowing mild to moderate at several levels.  No central compression.  Conservative treatment recommended.  She can follow-up if she develops radicular symptoms or myelopathic symptoms which we discussed in detail.  Follow-Up Instructions: No follow-ups on file.   Orders:  No orders of the defined types were placed in this encounter.  No orders of the defined types were placed in this encounter.     Procedures: No procedures performed   Clinical Data: No additional findings.   Subjective: Chief Complaint  Patient presents with   Neck - Pain, Follow-up    MRI review    HPI 57 year old female returns for follow-up of cervical spondylosis.  She states she wanted down to both her brother and mother had to have surgery for cervical fusion and both did not heal and ended up with additional pelvic bone graft and posterior fusion.  She has had pain in her neck it tends to radiate into her shoulders.  Review of Systems updated unchanged from 02/12/2023.   Objective: Vital Signs: Ht 5\' 6"  (1.676 m)   Wt 190 lb (86.2 kg)   LMP 04/29/2017 (Exact Date)   BMI 30.67 kg/m   Physical Exam Constitutional:      Appearance: She is well-developed.  HENT:     Head: Normocephalic.     Right Ear: External ear normal.     Left Ear: External ear normal. There is no impacted cerumen.  Eyes:     Pupils: Pupils are equal, round, and reactive to light.  Neck:     Thyroid: No thyromegaly.     Trachea: No tracheal  deviation.  Cardiovascular:     Rate and Rhythm: Normal rate.  Pulmonary:     Effort: Pulmonary effort is normal.  Abdominal:     Palpations: Abdomen is soft.  Musculoskeletal:     Cervical back: No rigidity.  Skin:    General: Skin is warm and dry.  Neurological:     Mental Status: She is alert and oriented to person, place, and time.  Psychiatric:        Behavior: Behavior normal.     Ortho Exam patient has intact upper extremity reflexes mild brachial plexus tenderness.  No isolated motor weakness upper extremities.  Specialty Comments:  No specialty comments available.  Imaging:  [facility] Outside Information   MRI Cervical Spine Wo Contrast  Anatomical Region Laterality Modality  C-spine -- Magnetic Resonance   Impression  1. Multilevel cervical spondylosis without significant spinal stenosis or frank cord impingement. 2. Multifactorial degenerative changes with resultant multilevel foraminal narrowing as above. Notable findings include moderate left C4 foraminal stenosis, mild to moderate right C5 foraminal narrowing, with moderate bilateral C6 foraminal stenosis. 3. Multilevel facet arthrosis, most pronounced at C3-4 on the left. Findings could contribute to underlying neck pain.   Electronically Signed   By: Sharlet Salina  Phill Myron M.D.   On: 05/01/2023 18:17 Narrative  CLINICAL DATA:  Initial evaluation for cervicalgia of, posterior neck pain, bilateral hand weakness.   PMFS History: Patient Active Problem List   Diagnosis Date Noted   Other spondylosis with radiculopathy, cervical region 02/22/2023   Unilateral primary osteoarthritis, right knee 02/22/2023   Bilateral hand pain 01/27/2023   Elbow pain, chronic 01/27/2023   Carpal tunnel syndrome on both sides 01/27/2023   Plantar fasciitis 01/27/2023   Bad memory 01/27/2023   Hematuria, microscopic 11/16/2022   Encounter for general adult medical examination with abnormal findings  09/27/2022   Need for immunization against influenza 09/27/2022   Pap smear for cervical cancer screening 09/27/2022   Lumbar pain with radiation down left leg 08/16/2022   No past medical history on file.  Family History  Problem Relation Age of Onset   Breast cancer Mother 23   Breast cancer Maternal Aunt 66    Past Surgical History:  Procedure Laterality Date   BREAST CYST ASPIRATION Right 04/05/2016   Social History   Occupational History   Not on file  Tobacco Use   Smoking status: Never   Smokeless tobacco: Not on file  Substance and Sexual Activity   Alcohol use: Not on file   Drug use: Not on file   Sexual activity: Not on file

## 2023-05-07 ENCOUNTER — Telehealth: Payer: Self-pay | Admitting: Orthopaedic Surgery

## 2023-05-07 NOTE — Telephone Encounter (Signed)
Please advise 

## 2023-05-07 NOTE — Telephone Encounter (Signed)
Patient called. She would like to know if she could have some pain medication? Her call back number is [phone]

## 2023-05-08 NOTE — Telephone Encounter (Signed)
noted 

## 2023-06-01 ENCOUNTER — Encounter: Payer: Self-pay | Admitting: Family Medicine

## 2023-06-01 ENCOUNTER — Ambulatory Visit (INDEPENDENT_AMBULATORY_CARE_PROVIDER_SITE_OTHER): Payer: BC Managed Care – PPO | Admitting: Family Medicine

## 2023-06-01 VITALS — BP 133/88 | HR 75 | Ht 66.0 in | Wt 193.1 lb

## 2023-06-01 DIAGNOSIS — M79605 Pain in left leg: Secondary | ICD-10-CM

## 2023-06-01 DIAGNOSIS — M545 Low back pain, unspecified: Secondary | ICD-10-CM

## 2023-06-01 NOTE — Patient Instructions (Addendum)
I appreciate the opportunity to provide care to you today!    Follow up:  3 months  -Continue taking amitriptyline, flexeril, diclofenac for pain relief   Referrals today- Neurosurgery  Attached with your AVS, you will find valuable resources for self-education. I highly recommend dedicating some time to thoroughly examine them.   Please continue to a heart-healthy diet and increase your physical activities. Try to exercise for at least five days a week.    It was a pleasure to see you and I look forward to continuing to work together on your health and well-being. Please do not hesitate to call the office if you need care or have questions about your care.  In case of emergency, please visit the Emergency Department for urgent care, or contact our clinic at [phone] to schedule an appointment. We're here to help you!   Have a wonderful day and week. With Gratitude, Gilmore Laroche MSN, FNP-BC

## 2023-06-01 NOTE — Progress Notes (Signed)
Established Patient Office Visit  Subjective:  Patient ID: Marissa Coleman, female    DOB: 07-12-1966  Age: 57 y.o. MRN: [id number]  CC:  Chief Complaint  Patient presents with   Back Pain    Pt reports back pain would like a referral to a different neurology, not pleased with Dr. Ophelia Charter. Reports back pain worsens at night, two nights ago while laying in bed she had a feeling from waist down of numbness and pins and needles radiating down.    HPI Marissa Coleman is a 57 y.o. female with past medical history of cervicalgia, lumbar pain with radiation and planta fasciitis presents for complaints of lumbar pain with radiation down her right leg.  Patient has a longstanding history of back pain and has undergone 2 different back surgeries.  She is currently taking amitriptyline 10 mg by mouth at bedtime, Flexeril 5 mg as needed,diclofenac 75 mg as needed for mild to moderate pain.  She reports some relief of her symptoms amitriptyline.  She has been follow-up with Dr. Kevan Ny and has had imaging studies reviewed with nonsurgical indications reported.  The patient reports worsening of her lumbar pain at nighttime noting increased numbness with pacing minimal sensation radiating down her right lower extremity.  She reports bladder incontinence since her back surgery.  No bowel incontinence.  The patient is ambulating well with no antalgic gait noted.  She would like to follow-up for second opinion to another provider for her worsening symptoms.     History reviewed. No pertinent past medical history.  Past Surgical History:  Procedure Laterality Date   BREAST CYST ASPIRATION Right 04/05/2016    Family History  Problem Relation Age of Onset   Breast cancer Mother 48   Breast cancer Maternal Aunt 90    Social History   Socioeconomic History   Marital status: Married    Spouse name: Not on file   Number of children: Not on file   Years of education: Not on file   Highest education level:  Associate degree: academic program  Occupational History   Not on file  Tobacco Use   Smoking status: Never   Smokeless tobacco: Not on file  Substance and Sexual Activity   Alcohol use: Not on file   Drug use: Not on file   Sexual activity: Not on file  Other Topics Concern   Not on file  Social History Narrative   Not on file   Social Determinants of Health   Financial Resource Strain: Low Risk  (05/31/2023)   Overall Financial Resource Strain (CARDIA)    Difficulty of Paying Living Expenses: Not very hard  Food Insecurity: Food Insecurity Present (05/31/2023)   Hunger Vital Sign    Worried About Running Out of Food in the Last Year: Sometimes true    Ran Out of Food in the Last Year: Often true  Transportation Needs: No Transportation Needs (05/31/2023)   PRAPARE - Administrator, Civil Service (Medical): No    Lack of Transportation (Non-Medical): No  Physical Activity: Sufficiently Active (05/31/2023)   Exercise Vital Sign    Days of Exercise per Week: 5 days    Minutes of Exercise per Session: 60 min  Stress: No Stress Concern Present (05/31/2023)   [employer] of Occupational Health - Occupational Stress Questionnaire    Feeling of Stress : Only a little  Social Connections: Moderately Integrated (05/31/2023)   Social Connection and Isolation Panel [NHANES]    Frequency of Communication with  Friends and Family: Three times a week    Frequency of Social Gatherings with Friends and Family: Once a week    Attends Religious Services: 1 to 4 times per year    Active Member of [employer] or Organizations: No    Attends Engineer, structural: Not on file    Marital Status: Married  Intimate Partner Violence: Unknown (02/15/2022)   Received from [facility]   HITS    Physically Hurt: Not on file    Insult or Talk Down To: Not on file    Threaten Physical Harm: Not on file    Scream or Curse: Not on file    Outpatient Medications Prior to Visit   Medication Sig Dispense Refill   amitriptyline (ELAVIL) 10 MG tablet Take 1 tablet (10 mg total) by mouth at bedtime. 30 tablet 1   Cholecalciferol (VITAMIN D3) 3000 UNITS TABS Take by mouth.     diclofenac (VOLTAREN) 75 MG EC tablet Take 1 tablet (75 mg total) by mouth as needed for mild pain or moderate pain. 90 tablet 0   EPINEPHrine 0.3 mg/0.3 mL IJ SOAJ injection See admin instructions.     Magnesium Oxide 200 MG TABS See admin instructions.     Riboflavin 400 MG CAPS      SUMAtriptan (IMITREX) 50 MG tablet Take 1 tablet (50 mg total) by mouth every 2 (two) hours as needed for migraine. 10 tablet 0   No facility-administered medications prior to visit.    Allergies  Allergen Reactions   Morphine Nausea And Vomiting   Penicillins    Shellfish Allergy     ROS Review of Systems  Constitutional:  Negative for chills and fever.  Eyes:  Negative for visual disturbance.  Respiratory:  Negative for chest tightness and shortness of breath.   Musculoskeletal:  Positive for back pain.  Neurological:  Negative for dizziness and headaches.      Objective:    Physical Exam HENT:     Head: Normocephalic.     Mouth/Throat:     Mouth: Mucous membranes are moist.  Cardiovascular:     Rate and Rhythm: Normal rate.     Heart sounds: Normal heart sounds.  Pulmonary:     Effort: Pulmonary effort is normal.     Breath sounds: Normal breath sounds.  Musculoskeletal:     Lumbar back: Tenderness present.  Neurological:     Mental Status: She is alert.     BP 133/88   Pulse 75   Ht 5\' 6"  (1.676 m)   Wt 193 lb 1.3 oz (87.6 kg)   LMP 04/29/2017 (Exact Date)   SpO2 92%   BMI 31.16 kg/m  Wt Readings from Last 3 Encounters:  06/01/23 193 lb 1.3 oz (87.6 kg)  05/03/23 190 lb (86.2 kg)  02/22/23 189 lb (85.7 kg)    Lab Results  Component Value Date   TSH 0.784 01/26/2023   Lab Results  Component Value Date   WBC 4.4 01/26/2023   HGB 11.9 01/26/2023   HCT 36.7 01/26/2023    MCV 97 01/26/2023   PLT 198 01/26/2023   Lab Results  Component Value Date   NA 143 01/26/2023   K 4.2 01/26/2023   CO2 24 01/26/2023   GLUCOSE 67 (L) 01/26/2023   BUN 14 01/26/2023   CREATININE 1.18 (H) 01/26/2023   BILITOT 0.3 01/26/2023   ALKPHOS 63 01/26/2023   AST 18 01/26/2023   ALT 12 01/26/2023   PROT 6.4 01/26/2023  ALBUMIN 4.0 01/26/2023   CALCIUM 9.4 01/26/2023   EGFR 55 (L) 01/26/2023   Lab Results  Component Value Date   CHOL 179 01/26/2023   Lab Results  Component Value Date   HDL 52 01/26/2023   Lab Results  Component Value Date   LDLCALC 99 01/26/2023   Lab Results  Component Value Date   TRIG 161 (H) 01/26/2023   Lab Results  Component Value Date   CHOLHDL 3.4 01/26/2023   Lab Results  Component Value Date   HGBA1C 5.6 01/26/2023      Assessment & Plan:  Lumbar pain with radiation down left leg Assessment & Plan: Encouraged to continue current treatment regimen Referral placed to neurosurgery Supportive care of rest, heat application encourage She reports minimal relief with gabapentin when prescribed previously  Orders: -     Ambulatory referral to Neurosurgery  Note: This chart has been completed using Engineer, civil (consulting) software, and while attempts have been made to ensure accuracy, certain words and phrases may not be transcribed as intended.    Follow-up: Return in about 3 months (around 09/01/2023).   Gilmore Laroche, FNP

## 2023-06-01 NOTE — Assessment & Plan Note (Addendum)
Encouraged to continue current treatment regimen Referral placed to neurosurgery Supportive care of rest, heat application encourage She reports minimal relief with gabapentin when prescribed previously

## 2023-06-04 DIAGNOSIS — Z6831 Body mass index (BMI) 31.0-31.9, adult: Secondary | ICD-10-CM | POA: Diagnosis not present

## 2023-06-04 DIAGNOSIS — M48061 Spinal stenosis, lumbar region without neurogenic claudication: Secondary | ICD-10-CM | POA: Diagnosis not present

## 2023-06-08 DIAGNOSIS — M48061 Spinal stenosis, lumbar region without neurogenic claudication: Secondary | ICD-10-CM | POA: Diagnosis not present

## 2023-06-18 DIAGNOSIS — M4808 Spinal stenosis, sacral and sacrococcygeal region: Secondary | ICD-10-CM | POA: Diagnosis not present

## 2023-06-18 DIAGNOSIS — M5137 Other intervertebral disc degeneration, lumbosacral region: Secondary | ICD-10-CM | POA: Diagnosis not present

## 2023-06-18 DIAGNOSIS — M5126 Other intervertebral disc displacement, lumbar region: Secondary | ICD-10-CM | POA: Diagnosis not present

## 2023-06-18 DIAGNOSIS — M48061 Spinal stenosis, lumbar region without neurogenic claudication: Secondary | ICD-10-CM | POA: Diagnosis not present

## 2023-06-18 DIAGNOSIS — M5136 Other intervertebral disc degeneration, lumbar region: Secondary | ICD-10-CM | POA: Diagnosis not present

## 2023-07-09 DIAGNOSIS — M5416 Radiculopathy, lumbar region: Secondary | ICD-10-CM | POA: Diagnosis not present

## 2023-07-09 DIAGNOSIS — Z6831 Body mass index (BMI) 31.0-31.9, adult: Secondary | ICD-10-CM | POA: Diagnosis not present

## 2023-07-09 DIAGNOSIS — M48061 Spinal stenosis, lumbar region without neurogenic claudication: Secondary | ICD-10-CM | POA: Diagnosis not present

## 2023-07-26 DIAGNOSIS — M4727 Other spondylosis with radiculopathy, lumbosacral region: Secondary | ICD-10-CM | POA: Diagnosis not present

## 2023-07-26 DIAGNOSIS — M5117 Intervertebral disc disorders with radiculopathy, lumbosacral region: Secondary | ICD-10-CM | POA: Diagnosis not present

## 2023-07-26 DIAGNOSIS — M47817 Spondylosis without myelopathy or radiculopathy, lumbosacral region: Secondary | ICD-10-CM | POA: Diagnosis not present

## 2023-07-26 DIAGNOSIS — M5416 Radiculopathy, lumbar region: Secondary | ICD-10-CM | POA: Diagnosis not present

## 2023-07-26 DIAGNOSIS — M4807 Spinal stenosis, lumbosacral region: Secondary | ICD-10-CM | POA: Diagnosis not present

## 2023-07-26 DIAGNOSIS — M48061 Spinal stenosis, lumbar region without neurogenic claudication: Secondary | ICD-10-CM | POA: Diagnosis not present

## 2023-07-26 DIAGNOSIS — Z981 Arthrodesis status: Secondary | ICD-10-CM | POA: Diagnosis not present

## 2023-07-26 DIAGNOSIS — Z9889 Other specified postprocedural states: Secondary | ICD-10-CM | POA: Diagnosis not present

## 2023-08-31 ENCOUNTER — Ambulatory Visit: Payer: BC Managed Care – PPO | Admitting: Family Medicine

## 2023-10-01 ENCOUNTER — Ambulatory Visit: Payer: BC Managed Care – PPO | Admitting: Family Medicine

## 2023-10-01 ENCOUNTER — Encounter: Payer: Self-pay | Admitting: Family Medicine

## 2023-10-01 VITALS — BP 147/97 | HR 84 | Ht 66.0 in | Wt 199.1 lb

## 2023-10-01 DIAGNOSIS — N3946 Mixed incontinence: Secondary | ICD-10-CM

## 2023-10-01 DIAGNOSIS — G8929 Other chronic pain: Secondary | ICD-10-CM

## 2023-10-01 DIAGNOSIS — E781 Pure hyperglyceridemia: Secondary | ICD-10-CM | POA: Diagnosis not present

## 2023-10-01 DIAGNOSIS — Z23 Encounter for immunization: Secondary | ICD-10-CM | POA: Diagnosis not present

## 2023-10-01 DIAGNOSIS — M5441 Lumbago with sciatica, right side: Secondary | ICD-10-CM | POA: Diagnosis not present

## 2023-10-01 DIAGNOSIS — M5442 Lumbago with sciatica, left side: Secondary | ICD-10-CM

## 2023-10-01 DIAGNOSIS — M545 Low back pain, unspecified: Secondary | ICD-10-CM

## 2023-10-01 DIAGNOSIS — M79605 Pain in left leg: Secondary | ICD-10-CM

## 2023-10-01 DIAGNOSIS — R32 Unspecified urinary incontinence: Secondary | ICD-10-CM | POA: Insufficient documentation

## 2023-10-01 MED ORDER — METHYLPREDNISOLONE ACETATE 40 MG/ML IJ SUSP: 40.0000 mg | Freq: Once | INTRAMUSCULAR | Status: DC

## 2023-10-01 MED ORDER — METHYLPREDNISOLONE ACETATE 80 MG/ML IJ SUSP
40.0000 mg | Freq: Once | INTRAMUSCULAR | Status: AC
  Administered 2023-10-01: 40 mg via INTRAMUSCULAR

## 2023-10-01 MED ORDER — OXYBUTYNIN CHLORIDE ER 5 MG PO TB24: 5.0000 mg | ORAL_TABLET | Freq: Every day | ORAL | 2 refills | Status: DC

## 2023-10-01 MED ORDER — EPINEPHRINE 0.3 MG/0.3ML IJ SOAJ: 0.3000 mg | INTRAMUSCULAR | 3 refills | Status: AC | PRN

## 2023-10-01 NOTE — Assessment & Plan Note (Signed)
Urinalysis ordered  Trial on oxybutynin  5 mg at bedtime  Discussed non-pharmacological interventions try scheduling regular bathroom trips (every 2-4 hours) to prevent sudden urges. Perform Kegel exercises daily to strengthen pelvic floor muscles, which can improve bladder control. Avoid bladder irritants like caffeine, alcohol, and spicy foods, as these can increase urgency. Maintain a healthy weight, as excess weight can put pressure on the bladder. Stay hydrated, but try to limit fluid intake in the evening to prevent nighttime urgency. If symptoms persist follow up

## 2023-10-01 NOTE — Progress Notes (Signed)
Established Patient Office Visit   Subjective  Patient ID: Marissa Coleman, female    DOB: 11-Feb-1966  Age: 57 y.o. MRN: [id number]  Chief Complaint  Patient presents with   Follow-up    3 mo f/u   Flu Shingles shot.     She  has no past medical history on file.  HPI Patient presents to the clinic for chronic follow up. For the details of today's visit, please refer to assessment and plan.   Review of Systems  Constitutional:  Negative for fever.  Respiratory:  Negative for shortness of breath.   Cardiovascular:  Negative for chest pain.  Genitourinary:  Positive for frequency and urgency. Negative for dysuria and flank pain.  Musculoskeletal:  Positive for back pain and myalgias.      Objective:     BP (!) 147/97   Pulse 84   Ht 5\' 6"  (1.676 m)   Wt 199 lb 1.9 oz (90.3 kg)   LMP 04/29/2017 (Exact Date)   SpO2 98%   BMI 32.14 kg/m  BP Readings from Last 3 Encounters:  10/01/23 (!) 147/97  06/01/23 133/88  01/26/23 139/85      Physical Exam Vitals reviewed.  Constitutional:      General: She is not in acute distress.    Appearance: Normal appearance. She is not ill-appearing, toxic-appearing or diaphoretic.  HENT:     Head: Normocephalic.  Eyes:     General:        Right eye: No discharge.        Left eye: No discharge.     Conjunctiva/sclera: Conjunctivae normal.  Cardiovascular:     Rate and Rhythm: Normal rate.     Pulses: Normal pulses.     Heart sounds: Normal heart sounds.  Pulmonary:     Effort: Pulmonary effort is normal. No respiratory distress.     Breath sounds: Normal breath sounds.  Abdominal:     General: Bowel sounds are normal.     Palpations: Abdomen is soft.     Tenderness: There is no abdominal tenderness. There is no guarding.  Skin:    General: Skin is warm.     Capillary Refill: Capillary refill takes less than 2 seconds.  Neurological:     Mental Status: She is alert.  Psychiatric:        Mood and Affect: Mood normal.       No results found for any visits on 10/01/23.  The 10-year ASCVD risk score (Arnett DK, et al., 2019) is: 4.9%    Assessment & Plan:  Encounter for immunization -     Flu vaccine trivalent PF, 6mos and older(Flulaval,Afluria,Fluarix,Fluzone) -     Varicella-zoster vaccine IM  Need for shingles vaccine  Hypertriglyceridemia Assessment & Plan: Lipid panel ordered today I encourage over the counter omega-3 fish oil 1000 mg twice daily.  Advise lifestyle modifications follow diet low in saturated fat, reduce dietary salt intake, avoid fatty foods, maintain an exercise routine 3 to 5 days a week for a minimum total of 150 minutes.   Orders: -     BMP8+eGFR -     Lipid panel -     CBC with Differential/Platelet -     Urinalysis -     Urine Culture  Chronic low back pain with bilateral sciatica, unspecified back pain laterality -     methylPREDNISolone Acetate -     methylPREDNISolone Acetate  Lumbar pain with radiation down left leg Assessment & Plan: Depro-Medrol 40  IM injection given today We discussed the desired effects and potential side effects of the prescribed medication for back pain. Additionally, we reviewed non-pharmacological interventions, including the importance of rest, avoiding twisting, improper bending, and straining the lower back. I demonstrated proper body mechanics to prevent further injury and advised alternating between ice and heat therapy for relief. Stretching exercises for both the back and legs were recommended to improve flexibility and support recovery. The patient was advised to follow up if symptoms worsen or persist. The patient expressed understanding of the treatment plan, and all questions were thoroughly addressed.    Mixed stress and urge urinary incontinence Assessment & Plan: Urinalysis ordered  Trial on oxybutynin  5 mg at bedtime  Discussed non-pharmacological interventions try scheduling regular bathroom trips (every 2-4 hours)  to prevent sudden urges. Perform Kegel exercises daily to strengthen pelvic floor muscles, which can improve bladder control. Avoid bladder irritants like caffeine, alcohol, and spicy foods, as these can increase urgency. Maintain a healthy weight, as excess weight can put pressure on the bladder. Stay hydrated, but try to limit fluid intake in the evening to prevent nighttime urgency. If symptoms persist follow up    Other orders -     EPINEPHrine; Inject 0.3 mg into the muscle as needed for anaphylaxis.  Dispense: 1 each; Refill: 3 -     oxyBUTYnin Chloride ER; Take 1 tablet (5 mg total) by mouth at bedtime.  Dispense: 30 tablet; Refill: 2    Return in 4 weeks (on 10/29/2023), or if symptoms worsen or fail to improve, for 4 weeks for weight loss , [facility].   Cruzita Lederer Newman Nip, FNP

## 2023-10-01 NOTE — Assessment & Plan Note (Signed)
Depro-Medrol 40 IM injection given today We discussed the desired effects and potential side effects of the prescribed medication for back pain. Additionally, we reviewed non-pharmacological interventions, including the importance of rest, avoiding twisting, improper bending, and straining the lower back. I demonstrated proper body mechanics to prevent further injury and advised alternating between ice and heat therapy for relief. Stretching exercises for both the back and legs were recommended to improve flexibility and support recovery. The patient was advised to follow up if symptoms worsen or persist. The patient expressed understanding of the treatment plan, and all questions were thoroughly addressed.

## 2023-10-01 NOTE — Assessment & Plan Note (Signed)
Lipid panel ordered today I encourage over the counter omega-3 fish oil 1000 mg twice daily.  Advise lifestyle modifications follow diet low in saturated fat, reduce dietary salt intake, avoid fatty foods, maintain an exercise routine 3 to 5 days a week for a minimum total of 150 minutes.

## 2023-10-01 NOTE — Patient Instructions (Addendum)
        Great to see you today.  I have refilled the medication(s) we provide.     Current approved weight loss injections include:    Wegovy   Zepbound (tirzepatide)  Saxenda (liraglutide)  --------------------------------------------------------------------------------  Other option:  Phentermine Oral pills 3 months only due cardiovascular effects   - Please take medications as prescribed. - Follow up with your primary health provider if any health concerns arises. - If symptoms worsen please contact your primary care provider and/or visit the emergency department.

## 2023-10-04 DIAGNOSIS — M5416 Radiculopathy, lumbar region: Secondary | ICD-10-CM | POA: Diagnosis not present

## 2023-10-08 DIAGNOSIS — E781 Pure hyperglyceridemia: Secondary | ICD-10-CM | POA: Diagnosis not present

## 2023-10-09 DIAGNOSIS — E781 Pure hyperglyceridemia: Secondary | ICD-10-CM | POA: Diagnosis not present

## 2023-10-09 LAB — LIPID PANEL
Chol/HDL Ratio: 3.9 {ratio} (ref 0.0–4.4)
Cholesterol, Total: 189 mg/dL (ref 100–199)
HDL: 49 mg/dL (ref >39)
LDL Chol Calc (NIH): 124 mg/dL — ABNORMAL HIGH (ref 0–99)
Triglycerides: 87 mg/dL (ref 0–149)
VLDL Cholesterol Cal: 16 mg/dL (ref 5–40)

## 2023-10-09 LAB — CBC WITH DIFFERENTIAL/PLATELET
Basophils Absolute: 0 10*3/uL (ref 0.0–0.2)
Basos: 1 %
EOS (ABSOLUTE): 0.1 10*3/uL (ref 0.0–0.4)
Eos: 3 %
Hematocrit: 37.3 % (ref 34.0–46.6)
Hemoglobin: 12.5 g/dL (ref 11.1–15.9)
Immature Grans (Abs): 0 10*3/uL (ref 0.0–0.1)
Immature Granulocytes: 0 %
Lymphocytes Absolute: 1 10*3/uL (ref 0.7–3.1)
Lymphs: 25 %
MCH: 32.4 pg (ref 26.6–33.0)
MCHC: 33.5 g/dL (ref 31.5–35.7)
MCV: 97 fL (ref 79–97)
Monocytes Absolute: 0.3 10*3/uL (ref 0.1–0.9)
Monocytes: 8 %
Neutrophils Absolute: 2.7 10*3/uL (ref 1.4–7.0)
Neutrophils: 63 %
Platelets: 241 10*3/uL (ref 150–450)
RBC: 3.86 x10E6/uL (ref 3.77–5.28)
RDW: 12.9 % (ref 11.7–15.4)
WBC: 4.2 10*3/uL (ref 3.4–10.8)

## 2023-10-09 LAB — BMP8+EGFR
BUN/Creatinine Ratio: 14 (ref 9–23)
BUN: 12 mg/dL (ref 6–24)
CO2: 24 mmol/L (ref 20–29)
Calcium: 9.1 mg/dL (ref 8.7–10.2)
Chloride: 104 mmol/L (ref 96–106)
Creatinine, Ser: 0.84 mg/dL (ref 0.57–1.00)
Glucose: 87 mg/dL (ref 70–99)
Potassium: 4 mmol/L (ref 3.5–5.2)
Sodium: 143 mmol/L (ref 134–144)
eGFR: 82 mL/min/{1.73_m2} (ref >59)

## 2023-10-15 ENCOUNTER — Other Ambulatory Visit: Payer: Self-pay | Admitting: Family Medicine

## 2023-10-15 LAB — URINE CULTURE

## 2023-10-15 MED ORDER — SULFAMETHOXAZOLE-TRIMETHOPRIM 800-160 MG PO TABS: 1.0000 | ORAL_TABLET | Freq: Two times a day (BID) | ORAL | 0 refills | Status: AC

## 2023-10-29 ENCOUNTER — Encounter: Payer: Self-pay | Admitting: Family Medicine

## 2023-10-29 ENCOUNTER — Ambulatory Visit (INDEPENDENT_AMBULATORY_CARE_PROVIDER_SITE_OTHER): Payer: BC Managed Care – PPO | Admitting: Family Medicine

## 2023-10-29 VITALS — BP 132/89 | HR 75 | Ht 66.0 in | Wt 208.1 lb

## 2023-10-29 DIAGNOSIS — E038 Other specified hypothyroidism: Secondary | ICD-10-CM

## 2023-10-29 DIAGNOSIS — G43009 Migraine without aura, not intractable, without status migrainosus: Secondary | ICD-10-CM | POA: Diagnosis not present

## 2023-10-29 DIAGNOSIS — E66811 Obesity, class 1: Secondary | ICD-10-CM

## 2023-10-29 DIAGNOSIS — E7849 Other hyperlipidemia: Secondary | ICD-10-CM

## 2023-10-29 DIAGNOSIS — E781 Pure hyperglyceridemia: Secondary | ICD-10-CM

## 2023-10-29 DIAGNOSIS — M545 Low back pain, unspecified: Secondary | ICD-10-CM | POA: Diagnosis not present

## 2023-10-29 DIAGNOSIS — R7301 Impaired fasting glucose: Secondary | ICD-10-CM

## 2023-10-29 DIAGNOSIS — E559 Vitamin D deficiency, unspecified: Secondary | ICD-10-CM

## 2023-10-29 DIAGNOSIS — M79605 Pain in left leg: Secondary | ICD-10-CM

## 2023-10-29 MED ORDER — CYCLOBENZAPRINE HCL 10 MG PO TABS: 10.0000 mg | ORAL_TABLET | Freq: Every day | ORAL | 0 refills | Status: AC

## 2023-10-29 MED ORDER — DICLOFENAC SODIUM 75 MG PO TBEC: 75.0000 mg | DELAYED_RELEASE_TABLET | ORAL | 0 refills | Status: DC | PRN

## 2023-10-29 MED ORDER — SUMATRIPTAN SUCCINATE 50 MG PO TABS: 50.0000 mg | ORAL_TABLET | ORAL | 0 refills | Status: DC | PRN

## 2023-10-29 MED ORDER — PHENTERMINE HCL 15 MG PO CAPS: 15.0000 mg | ORAL_CAPSULE | ORAL | 0 refills | Status: DC

## 2023-10-29 NOTE — Progress Notes (Signed)
Established Patient Office Visit  Subjective:  Patient ID: Marissa Coleman, female    DOB: [birth year]  Age: 57 y.o. MRN: [id number]  CC:  Chief Complaint  Patient presents with   Care Management    4 week f/u, states she checked with her insurance and they do not cover medication recommended at last visit, would like other options.     HPI Marissa Coleman is a 57 y.o. female with past medical history of migraines, obesity, hypertriglyceridemia presents for f/u of  chronic medical conditions. For the details of today's visit, please refer to the assessment and plan.     History reviewed. No pertinent past medical history.  Past Surgical History:  Procedure Laterality Date   BREAST CYST ASPIRATION Right 04/05/2016    Family History  Problem Relation Age of Onset   Breast cancer Mother 70   Breast cancer Maternal Aunt 22    Social History   Socioeconomic History   Marital status: Married    Spouse name: Not on file   Number of children: Not on file   Years of education: Not on file   Highest education level: 12th grade  Occupational History   Not on file  Tobacco Use   Smoking status: Never   Smokeless tobacco: Not on file  Substance and Sexual Activity   Alcohol use: Not on file   Drug use: Not on file   Sexual activity: Not on file  Other Topics Concern   Not on file  Social History Narrative   Not on file   Social Drivers of Health   Financial Resource Strain: Low Risk  (10/28/2023)   Overall Financial Resource Strain (CARDIA)    Difficulty of Paying Living Expenses: Not very hard  Food Insecurity: Food Insecurity Present (10/28/2023)   Hunger Vital Sign    Worried About Running Out of Food in the Last Year: Sometimes true    Ran Out of Food in the Last Year: Sometimes true  Transportation Needs: No Transportation Needs (10/28/2023)   PRAPARE - Administrator, Civil Service (Medical): No    Lack of Transportation (Non-Medical): No  Physical  Activity: Inactive (10/28/2023)   Exercise Vital Sign    Days of Exercise per Week: 0 days    Minutes of Exercise per Session: 60 min  Stress: No Stress Concern Present (10/28/2023)   [employer] of Occupational Health - Occupational Stress Questionnaire    Feeling of Stress : Only a little  Social Connections: Moderately Isolated (10/28/2023)   Social Connection and Isolation Panel [NHANES]    Frequency of Communication with Friends and Family: Once a week    Frequency of Social Gatherings with Friends and Family: Once a week    Attends Religious Services: 1 to 4 times per year    Active Member of [employer] or Organizations: No    Attends Engineer, structural: Not on file    Marital Status: Married  Intimate Partner Violence: Unknown (02/15/2022)   Received from [employer]   HITS    Physically Hurt: Not on file    Insult or Talk Down To: Not on file    Threaten Physical Harm: Not on file    Scream or Curse: Not on file    Outpatient Medications Prior to Visit  Medication Sig Dispense Refill   Cholecalciferol (VITAMIN D3) 3000 UNITS TABS Take by mouth.     EPINEPHrine 0.3 mg/0.3 mL IJ SOAJ injection Inject 0.3 mg into the  muscle as needed for anaphylaxis. 1 each 3   gabapentin (NEURONTIN) 300 MG capsule Take 300 mg by mouth 2 (two) times daily.     Magnesium Oxide 200 MG TABS See admin instructions.     oxybutynin (DITROPAN-XL) 5 MG 24 hr tablet Take 1 tablet (5 mg total) by mouth at bedtime. 30 tablet 2   oxyCODONE-acetaminophen (PERCOCET/ROXICET) 5-325 MG tablet Take 1 tablet by mouth every 6 (six) hours as needed for severe pain (pain score 7-10).     Riboflavin 400 MG CAPS      amitriptyline (ELAVIL) 10 MG tablet Take 1 tablet (10 mg total) by mouth at bedtime. 30 tablet 1   cyclobenzaprine (FLEXERIL) 10 MG tablet Take 10 mg by mouth at bedtime.     diclofenac (VOLTAREN) 75 MG EC tablet Take 1 tablet (75 mg total) by mouth as needed for mild pain or  moderate pain. 90 tablet 0   SUMAtriptan (IMITREX) 50 MG tablet Take 1 tablet (50 mg total) by mouth every 2 (two) hours as needed for migraine. 10 tablet 0   methylPREDNISolone acetate (DEPO-MEDROL) injection 40 mg      No facility-administered medications prior to visit.    Allergies  Allergen Reactions   Morphine Nausea And Vomiting   Penicillins    Shellfish Allergy     ROS Review of Systems  Constitutional:  Negative for chills and fever.  Eyes:  Negative for visual disturbance.  Respiratory:  Negative for chest tightness and shortness of breath.   Neurological:  Negative for dizziness and headaches.      Objective:    Physical Exam Constitutional:      Appearance: She is obese.  HENT:     Head: Normocephalic.     Mouth/Throat:     Mouth: Mucous membranes are moist.  Cardiovascular:     Rate and Rhythm: Normal rate.     Heart sounds: Normal heart sounds.  Pulmonary:     Effort: Pulmonary effort is normal.     Breath sounds: Normal breath sounds.  Neurological:     Mental Status: She is alert.     BP 132/89   Pulse 75   Ht 5\' 6"  (1.676 m)   Wt 208 lb 1.3 oz (94.4 kg)   LMP 04/29/2017 (Exact Date)   SpO2 95%   BMI 33.58 kg/m  Wt Readings from Last 3 Encounters:  10/29/23 208 lb 1.3 oz (94.4 kg)  10/01/23 199 lb 1.9 oz (90.3 kg)  06/01/23 193 lb 1.3 oz (87.6 kg)    Lab Results  Component Value Date   TSH 0.784 01/26/2023   Lab Results  Component Value Date   WBC 4.2 10/08/2023   HGB 12.5 10/08/2023   HCT 37.3 10/08/2023   MCV 97 10/08/2023   PLT 241 10/08/2023   Lab Results  Component Value Date   NA 143 10/08/2023   K 4.0 10/08/2023   CO2 24 10/08/2023   GLUCOSE 87 10/08/2023   BUN 12 10/08/2023   CREATININE 0.84 10/08/2023   BILITOT 0.3 01/26/2023   ALKPHOS 63 01/26/2023   AST 18 01/26/2023   ALT 12 01/26/2023   PROT 6.4 01/26/2023   ALBUMIN 4.0 01/26/2023   CALCIUM 9.1 10/08/2023   EGFR 82 10/08/2023   Lab Results  Component  Value Date   CHOL 189 10/08/2023   Lab Results  Component Value Date   HDL 49 10/08/2023   Lab Results  Component Value Date   LDLCALC 124 (H) 10/08/2023  Lab Results  Component Value Date   TRIG 87 10/08/2023   Lab Results  Component Value Date   CHOLHDL 3.9 10/08/2023   Lab Results  Component Value Date   HGBA1C 5.6 01/26/2023      Assessment & Plan:  Obesity (BMI 30.0-34.9) Assessment & Plan: The patient reports that her insurance did not cover Wegovy or Zepbound. She mentions having difficulty maintaining her physical activity since her back surgery, noting that she has not been as active due to doctor's orders. She expresses interest in starting weight loss medication to aid in her weight loss journey. The patient has been compliant with a heart-healthy diet and denies any cardiovascular history.  We will initiate therapy with phentermine 15 mg daily. The common side effects of the medication, including insomnia, dry mouth, palpitations, hypertension, and arrhythmias, were reviewed with the patient. She was encouraged to follow up monthly to assess the effectiveness of the treatment and her response to the medication. The patient verbalized understanding and is aware of the plan of care. Wt Readings from Last 3 Encounters:  10/29/23 208 lb 1.3 oz (94.4 kg)  10/01/23 199 lb 1.9 oz (90.3 kg)  06/01/23 193 lb 1.3 oz (87.6 kg)     Orders: -     Phentermine HCl; Take 1 capsule (15 mg total) by mouth every morning.  Dispense: 30 capsule; Refill: 0  Hypertriglyceridemia Assessment & Plan: Encouraged a heart-healthy diet with increased physical activity. The patient verbalized understanding. Lab Results  Component Value Date   CHOL 189 10/08/2023   HDL 49 10/08/2023   LDLCALC 124 (H) 10/08/2023   TRIG 87 10/08/2023   CHOLHDL 3.9 10/08/2023      Migraine without aura, not intractable, without status migrainosus Assessment & Plan: Stable  Refilled  sumatriptan  Orders: -     SUMAtriptan Succinate; Take 1 tablet (50 mg total) by mouth every 2 (two) hours as needed for migraine.  Dispense: 10 tablet; Refill: 0  Lumbar pain with radiation down left leg Assessment & Plan: Refilled Flexeril and Voltaren 75 mg Encouraged to follow-up with her surgeon as scheduled  Orders: -     Cyclobenzaprine HCl; Take 1 tablet (10 mg total) by mouth at bedtime.  Dispense: 30 tablet; Refill: 0 -     Diclofenac Sodium; Take 1 tablet (75 mg total) by mouth as needed for mild pain (pain score 1-3) or moderate pain (pain score 4-6).  Dispense: 90 tablet; Refill: 0  IFG (impaired fasting glucose) -     Hemoglobin A1c  Vitamin D deficiency -     VITAMIN D 25 Hydroxy (Vit-D Deficiency, Fractures)  TSH (thyroid-stimulating hormone deficiency) -     TSH + free T4  Other hyperlipidemia -     CBC with Differential/Platelet -     CMP14+EGFR -     Lipid panel  Note: This chart has been completed using Engineer, civil (consulting) software, and while attempts have been made to ensure accuracy, certain words and phrases may not be transcribed as intended.    Follow-up: Return in about 1 month (around 11/29/2023).   Gilmore Laroche, FNP

## 2023-10-29 NOTE — Assessment & Plan Note (Signed)
Refilled Flexeril and Voltaren 75 mg Encouraged to follow-up with her surgeon as scheduled

## 2023-10-29 NOTE — Assessment & Plan Note (Signed)
The patient reports that her insurance did not cover Wegovy or Zepbound. She mentions having difficulty maintaining her physical activity since her back surgery, noting that she has not been as active due to doctor's orders. She expresses interest in starting weight loss medication to aid in her weight loss journey. The patient has been compliant with a heart-healthy diet and denies any cardiovascular history.  We will initiate therapy with phentermine 15 mg daily. The common side effects of the medication, including insomnia, dry mouth, palpitations, hypertension, and arrhythmias, were reviewed with the patient. She was encouraged to follow up monthly to assess the effectiveness of the treatment and her response to the medication. The patient verbalized understanding and is aware of the plan of care. Wt Readings from Last 3 Encounters:  10/29/23 208 lb 1.3 oz (94.4 kg)  10/01/23 199 lb 1.9 oz (90.3 kg)  06/01/23 193 lb 1.3 oz (87.6 kg)

## 2023-10-29 NOTE — Assessment & Plan Note (Signed)
Stable  Refilled sumatriptan

## 2023-10-29 NOTE — Assessment & Plan Note (Signed)
Encouraged a heart-healthy diet with increased physical activity. The patient verbalized understanding. Lab Results  Component Value Date   CHOL 189 10/08/2023   HDL 49 10/08/2023   LDLCALC 124 (H) 10/08/2023   TRIG 87 10/08/2023   CHOLHDL 3.9 10/08/2023

## 2023-10-29 NOTE — Patient Instructions (Addendum)
I appreciate the opportunity to provide care to you today!    Follow up:  1 months  Labs: please stop by the lab 2-3 days before your next appt to get your blood drawn (CBC, CMP, TSH, Lipid profile, HgA1c, Vit D)  Weight Loss: Start by taking 15 mg of phentermine daily. I recommend implementing lifestyle changes, including a heart-healthy diet and increased physical activity, alongside the treatment regimen for optimal results. Please report any side effects such as palpitations, hypertension, arrhythmias, dry mouth, or insomnia while on the treatment regimen. We will follow up once a month to assess the effectiveness of the medication and monitor the side effect profile.  Attached with your AVS, you will find valuable resources for self-education. I highly recommend dedicating some time to thoroughly examine them.   Please continue to a heart-healthy diet and increase your physical activities. Try to exercise for at least five days a week.    It was a pleasure to see you and I look forward to continuing to work together on your health and well-being. Please do not hesitate to call the office if you need care or have questions about your care.  In case of emergency, please visit the Emergency Department for urgent care, or contact our clinic at [phone] to schedule an appointment. We're here to help you!   Have a wonderful day and week. With Gratitude, Gilmore Laroche MSN, FNP-BC

## 2023-11-29 ENCOUNTER — Other Ambulatory Visit: Payer: Self-pay | Admitting: Family Medicine

## 2023-11-29 DIAGNOSIS — E66811 Obesity, class 1: Secondary | ICD-10-CM

## 2023-11-29 NOTE — Telephone Encounter (Signed)
Copied from CRM [id number]. Topic: Clinical - Medication Refill >> Nov 29, 2023  2:12 PM Shelah Lewandowsky wrote: Most Recent Primary Care Visit:  Provider: Gilmore Laroche  Department: [facility]  Visit Type: OFFICE VISIT  Date: 10/29/2023  Medication: ***  Has the patient contacted their pharmacy?  (Agent: If no, request that the patient contact the pharmacy for the refill. If patient does not wish to contact the pharmacy document the reason why and proceed with request.) (Agent: If yes, when and what did the pharmacy advise?)  Is this the correct pharmacy for this prescription?  If no, delete pharmacy and type the correct one.  This is the patient's preferred pharmacy:  [facility] [address], [place] - [address] [place] [address] [place] [address] Phone: [phone] Fax: [phone]   Has the prescription been filled recently?   Is the patient out of the medication?   Has the patient been seen for an appointment in the last year OR does the patient have an upcoming appointment?   Can we respond through MyChart?   Agent: Please be advised that Rx refills may take up to 3 business days. We ask that you follow-up with your pharmacy.

## 2023-11-30 ENCOUNTER — Ambulatory Visit: Payer: BC Managed Care – PPO | Admitting: Family Medicine

## 2023-12-30 ENCOUNTER — Other Ambulatory Visit: Payer: Self-pay | Admitting: Family Medicine

## 2024-01-23 DIAGNOSIS — E7849 Other hyperlipidemia: Secondary | ICD-10-CM | POA: Diagnosis not present

## 2024-01-24 LAB — CMP14+EGFR
ALT: 12 IU/L (ref 0–32)
AST: 19 IU/L (ref 0–40)
Albumin: 3.9 g/dL (ref 3.8–4.9)
Alkaline Phosphatase: 73 IU/L (ref 44–121)
BUN/Creatinine Ratio: 10 (ref 9–23)
BUN: 10 mg/dL (ref 6–24)
Bilirubin Total: 0.4 mg/dL (ref 0.0–1.2)
CO2: 25 mmol/L (ref 20–29)
Calcium: 8.6 mg/dL — ABNORMAL LOW (ref 8.7–10.2)
Chloride: 102 mmol/L (ref 96–106)
Creatinine, Ser: 0.98 mg/dL (ref 0.57–1.00)
Globulin, Total: 2.5 g/dL (ref 1.5–4.5)
Glucose: 88 mg/dL (ref 70–99)
Potassium: 3.9 mmol/L (ref 3.5–5.2)
Sodium: 140 mmol/L (ref 134–144)
Total Protein: 6.4 g/dL (ref 6.0–8.5)
eGFR: 68 mL/min/{1.73_m2} (ref >59)

## 2024-01-24 LAB — CBC WITH DIFFERENTIAL/PLATELET
Basophils Absolute: 0 10*3/uL (ref 0.0–0.2)
Basos: 1 %
EOS (ABSOLUTE): 0.2 10*3/uL (ref 0.0–0.4)
Eos: 5 %
Hematocrit: 38.2 % (ref 34.0–46.6)
Hemoglobin: 12.6 g/dL (ref 11.1–15.9)
Immature Grans (Abs): 0 10*3/uL (ref 0.0–0.1)
Immature Granulocytes: 0 %
Lymphocytes Absolute: 1 10*3/uL (ref 0.7–3.1)
Lymphs: 23 %
MCH: 32.5 pg (ref 26.6–33.0)
MCHC: 33 g/dL (ref 31.5–35.7)
MCV: 99 fL — ABNORMAL HIGH (ref 79–97)
Monocytes Absolute: 0.3 10*3/uL (ref 0.1–0.9)
Monocytes: 7 %
Neutrophils Absolute: 2.7 10*3/uL (ref 1.4–7.0)
Neutrophils: 64 %
Platelets: 212 10*3/uL (ref 150–450)
RBC: 3.88 x10E6/uL (ref 3.77–5.28)
RDW: 13.1 % (ref 11.7–15.4)
WBC: 4.2 10*3/uL (ref 3.4–10.8)

## 2024-01-24 LAB — LIPID PANEL
Chol/HDL Ratio: 3.7 ratio (ref 0.0–4.4)
Cholesterol, Total: 179 mg/dL (ref 100–199)
HDL: 49 mg/dL (ref >39)
LDL Chol Calc (NIH): 112 mg/dL — ABNORMAL HIGH (ref 0–99)
Triglycerides: 99 mg/dL (ref 0–149)
VLDL Cholesterol Cal: 18 mg/dL (ref 5–40)

## 2024-01-26 ENCOUNTER — Encounter: Payer: Self-pay | Admitting: Family Medicine

## 2024-01-28 ENCOUNTER — Ambulatory Visit (INDEPENDENT_AMBULATORY_CARE_PROVIDER_SITE_OTHER): Payer: Self-pay | Admitting: Family Medicine

## 2024-01-28 ENCOUNTER — Encounter: Payer: Self-pay | Admitting: Family Medicine

## 2024-01-28 VITALS — BP 155/82 | HR 67 | Ht 66.0 in | Wt 198.1 lb

## 2024-01-28 DIAGNOSIS — G43009 Migraine without aura, not intractable, without status migrainosus: Secondary | ICD-10-CM | POA: Diagnosis not present

## 2024-01-28 DIAGNOSIS — E66811 Obesity, class 1: Secondary | ICD-10-CM | POA: Diagnosis not present

## 2024-01-28 DIAGNOSIS — N3281 Overactive bladder: Secondary | ICD-10-CM | POA: Diagnosis not present

## 2024-01-28 DIAGNOSIS — M545 Low back pain, unspecified: Secondary | ICD-10-CM | POA: Diagnosis not present

## 2024-01-28 DIAGNOSIS — M7989 Other specified soft tissue disorders: Secondary | ICD-10-CM | POA: Insufficient documentation

## 2024-01-28 DIAGNOSIS — M79605 Pain in left leg: Secondary | ICD-10-CM

## 2024-01-28 MED ORDER — DICLOFENAC SODIUM 75 MG PO TBEC
75.0000 mg | DELAYED_RELEASE_TABLET | ORAL | 0 refills | Status: DC | PRN
Start: 2024-01-28 — End: 2024-04-10

## 2024-01-28 MED ORDER — OXYBUTYNIN CHLORIDE ER 5 MG PO TB24: 5.0000 mg | ORAL_TABLET | Freq: Every day | ORAL | 0 refills | Status: AC

## 2024-01-28 MED ORDER — SUMATRIPTAN SUCCINATE 50 MG PO TABS
50.0000 mg | ORAL_TABLET | ORAL | 0 refills | Status: DC | PRN
Start: 2024-01-28 — End: 2024-04-10

## 2024-01-28 MED ORDER — PHENTERMINE HCL 30 MG PO CAPS: 30.0000 mg | ORAL_CAPSULE | ORAL | 1 refills | Status: AC

## 2024-01-28 NOTE — Patient Instructions (Signed)
 I appreciate the opportunity to provide care to you today!    Follow up:  1 months  Start taking phentermine 30 mg daily. For optimal results with weight loss, I recommend:  Decreasing portion sizes. Reducing sugar, sodium, and carbohydrate intake, and limiting saturated fats in your diet. Increasing your fiber intake by incorporating more whole grains, fruits, and vegetables. Setting healthy goals and focusing on lowering carbs, sugar, and fat. Increasing the variety of fruits and vegetables in your diet. Reducing soda consumption and limiting processed foods. In addition to taking your weight loss medication, engage in moderate-intensity physical activity for at least 150 minutes per week for the best results.   Please seek immediate medical attention at the Emergency Department if you experience any of the following symptoms in conjunction with your headaches:  Acute onset of a severe headache described as "the worst headache of my life," particularly in a patient with no prior history of headaches. Unrelenting headaches that do not improve with conservative treatments or pain that progressively worsens. Lancinating or "ice-pick" pain. Severe headaches accompanied by a stiff neck and/or fever. Headaches associated with changes in mental status or level of consciousness. Persistent headaches following trauma to the head or neck. A significant change in pattern or severity of headaches in a patient with a longstanding history of chronic headaches. Your prompt evaluation is crucial for appropriate diagnosis and management.     Please continue to a heart-healthy diet and increase your physical activities. Try to exercise for at least five days a week.    It was a pleasure to see you and I look forward to continuing to work together on your health and well-being. Please do not hesitate to call the office if you need care or have questions about your care.  In case of emergency,  please visit the Emergency Department for urgent care, or contact our clinic at [phone] to schedule an appointment. We're here to help you!   Have a wonderful day and week. With Gratitude, Gilmore Laroche MSN, FNP-BC

## 2024-01-28 NOTE — Assessment & Plan Note (Signed)
 Rx refilled.

## 2024-01-28 NOTE — Progress Notes (Signed)
 Established Patient Office Visit  Subjective:  Patient ID: Marissa Coleman, female    DOB: 09-14-1966  Age: 58 y.o. MRN: [id number]  CC:  Chief Complaint  Patient presents with   Medical Management of Chronic Issues    1 month chronic follow up Would like to restart Phentermine     HPI Marissa Coleman is a 58 y.o. female  presents for f/u of obesity follow up.  For the details of today's visit, please refer to the assessment and plan.    History reviewed. No pertinent past medical history.  Past Surgical History:  Procedure Laterality Date   BREAST CYST ASPIRATION Right 04/05/2016    Family History  Problem Relation Age of Onset   Breast cancer Mother 109   Breast cancer Maternal Aunt 60    Social History   Socioeconomic History   Marital status: Married    Spouse name: Not on file   Number of children: Not on file   Years of education: Not on file   Highest education level: 12th grade  Occupational History   Not on file  Tobacco Use   Smoking status: Never   Smokeless tobacco: Not on file  Vaping Use   Vaping status: Never Used  Substance and Sexual Activity   Alcohol use: Not Currently   Drug use: Not Currently   Sexual activity: Not Currently  Other Topics Concern   Not on file  Social History Narrative   Not on file   Social Drivers of Health   Financial Resource Strain: Low Risk  (10/28/2023)   Overall Financial Resource Strain (CARDIA)    Difficulty of Paying Living Expenses: Not very hard  Food Insecurity: Food Insecurity Present (10/28/2023)   Hunger Vital Sign    Worried About Running Out of Food in the Last Year: Sometimes true    Ran Out of Food in the Last Year: Sometimes true  Transportation Needs: No Transportation Needs (10/28/2023)   PRAPARE - Administrator, Civil Service (Medical): No    Lack of Transportation (Non-Medical): No  Physical Activity: Inactive (10/28/2023)   Exercise Vital Sign    Days of Exercise per Week: 0  days    Minutes of Exercise per Session: 60 min  Stress: No Stress Concern Present (10/28/2023)   [employer] of Occupational Health - Occupational Stress Questionnaire    Feeling of Stress : Only a little  Social Connections: Moderately Isolated (10/28/2023)   Social Connection and Isolation Panel [NHANES]    Frequency of Communication with Friends and Family: Once a week    Frequency of Social Gatherings with Friends and Family: Once a week    Attends Religious Services: 1 to 4 times per year    Active Member of [employer] or Organizations: No    Attends Engineer, structural: Not on file    Marital Status: Married  Intimate Partner Violence: Unknown (02/15/2022)   Received from [employer], [facility]   HITS    Physically Hurt: Not on file    Insult or Talk Down To: Not on file    Threaten Physical Harm: Not on file    Scream or Curse: Not on file    Outpatient Medications Prior to Visit  Medication Sig Dispense Refill   Cholecalciferol (VITAMIN D3) 3000 UNITS TABS Take by mouth.     cyclobenzaprine (FLEXERIL) 5 MG tablet Take 1 tablet by mouth 3 (three) times daily as needed.     EPINEPHrine 0.3 mg/0.3 mL  IJ SOAJ injection Inject 0.3 mg into the muscle as needed for anaphylaxis. 1 each 3   gabapentin (NEURONTIN) 300 MG capsule Take 300 mg by mouth 2 (two) times daily.     Magnesium Oxide 200 MG TABS See admin instructions.     oxyCODONE-acetaminophen (PERCOCET/ROXICET) 5-325 MG tablet Take 1 tablet by mouth every 6 (six) hours as needed for severe pain (pain score 7-10).     Riboflavin 400 MG CAPS      SPIKEVAX syringe Inject 0.5 mLs into the muscle once.     diclofenac (VOLTAREN) 75 MG EC tablet Take 1 tablet (75 mg total) by mouth as needed for mild pain (pain score 1-3) or moderate pain (pain score 4-6). 90 tablet 0   oxybutynin (DITROPAN-XL) 5 MG 24 hr tablet TAKE 1 TABLET BY MOUTH AT BEDTIME 30 tablet 0   phentermine 15 MG capsule Take 1 capsule (15 mg total) by  mouth every morning. 30 capsule 0   SUMAtriptan (IMITREX) 50 MG tablet Take 1 tablet (50 mg total) by mouth every 2 (two) hours as needed for migraine. 10 tablet 0   No facility-administered medications prior to visit.    Allergies  Allergen Reactions   Shellfish Allergy Anaphylaxis   Iodinated Contrast Media Other (See Comments)    Uncoded Allergy. Allergen: shell fish   Iodine    Penicillins    Morphine Nausea And Vomiting   Penicillin G Rash    ROS Review of Systems  Constitutional:  Negative for chills and fever.  Eyes:  Negative for visual disturbance.  Respiratory:  Negative for chest tightness and shortness of breath.   Neurological:  Negative for dizziness and headaches.      Objective:    Physical Exam Constitutional:      Appearance: She is obese.  HENT:     Head: Normocephalic.     Mouth/Throat:     Mouth: Mucous membranes are moist.  Cardiovascular:     Rate and Rhythm: Normal rate.     Heart sounds: Normal heart sounds.  Pulmonary:     Effort: Pulmonary effort is normal.     Breath sounds: Normal breath sounds.  Neurological:     Mental Status: She is alert.     BP (!) 155/82   Pulse 67   Ht 5\' 6"  (1.676 m)   Wt 198 lb 1.9 oz (89.9 kg)   LMP 04/29/2017 (Exact Date)   SpO2 97%   BMI 31.98 kg/m  Wt Readings from Last 3 Encounters:  01/28/24 198 lb 1.9 oz (89.9 kg)  10/29/23 208 lb 1.3 oz (94.4 kg)  10/01/23 199 lb 1.9 oz (90.3 kg)    Lab Results  Component Value Date   TSH 0.784 01/26/2023   Lab Results  Component Value Date   WBC 4.2 01/23/2024   HGB 12.6 01/23/2024   HCT 38.2 01/23/2024   MCV 99 (H) 01/23/2024   PLT 212 01/23/2024   Lab Results  Component Value Date   NA 140 01/23/2024   K 3.9 01/23/2024   CO2 25 01/23/2024   GLUCOSE 88 01/23/2024   BUN 10 01/23/2024   CREATININE 0.98 01/23/2024   BILITOT 0.4 01/23/2024   ALKPHOS 73 01/23/2024   AST 19 01/23/2024   ALT 12 01/23/2024   PROT 6.4 01/23/2024   ALBUMIN 3.9  01/23/2024   CALCIUM 8.6 (L) 01/23/2024   EGFR 68 01/23/2024   Lab Results  Component Value Date   CHOL 179 01/23/2024   Lab  Results  Component Value Date   HDL 49 01/23/2024   Lab Results  Component Value Date   LDLCALC 112 (H) 01/23/2024   Lab Results  Component Value Date   TRIG 99 01/23/2024   Lab Results  Component Value Date   CHOLHDL 3.7 01/23/2024   Lab Results  Component Value Date   HGBA1C 5.6 01/26/2023      Assessment & Plan:  Obesity (BMI 30.0-34.9) Assessment & Plan: Encouraged to start taking phentermine 30 mg daily. For optimal results with weight loss, I recommend:  Decreasing portion sizes. Reducing sugar, sodium, and carbohydrate intake, and limiting saturated fats in your diet. Increasing your fiber intake by incorporating more whole grains, fruits, and vegetables. Setting healthy goals and focusing on lowering carbs, sugar, and fat. Increasing the variety of fruits and vegetables in your diet. Reducing soda consumption and limiting processed foods. In addition to taking your weight loss medication, engage in moderate-intensity physical activity for at least 150 minutes per week for the best results.   Orders: -     Phentermine HCl; Take 1 capsule (30 mg total) by mouth every morning.  Dispense: 30 capsule; Refill: 1  Overactive bladder Assessment & Plan: Rx refilled  Orders: -     oxyBUTYnin Chloride ER; Take 1 tablet (5 mg total) by mouth at bedtime.  Dispense: 30 tablet; Refill: 0  Lumbar pain with radiation down left leg -     Diclofenac Sodium; Take 1 tablet (75 mg total) by mouth as needed for mild pain (pain score 1-3) or moderate pain (pain score 4-6).  Dispense: 90 tablet; Refill: 0  Migraine without aura, not intractable, without status migrainosus -     SUMAtriptan Succinate; Take 1 tablet (50 mg total) by mouth every 2 (two) hours as needed for migraine.  Dispense: 10 tablet; Refill: 0  Note: This chart has been completed  using Engineer, civil (consulting) software, and while attempts have been made to ensure accuracy, certain words and phrases may not be transcribed as intended.    Follow-up: Return in about 1 month (around 02/28/2024).   Gilmore Laroche, FNP

## 2024-01-28 NOTE — Assessment & Plan Note (Signed)
 Encouraged to start taking phentermine 30 mg daily. For optimal results with weight loss, I recommend:  Decreasing portion sizes. Reducing sugar, sodium, and carbohydrate intake, and limiting saturated fats in your diet. Increasing your fiber intake by incorporating more whole grains, fruits, and vegetables. Setting healthy goals and focusing on lowering carbs, sugar, and fat. Increasing the variety of fruits and vegetables in your diet. Reducing soda consumption and limiting processed foods. In addition to taking your weight loss medication, engage in moderate-intensity physical activity for at least 150 minutes per week for the best results.

## 2024-02-18 DIAGNOSIS — Z6831 Body mass index (BMI) 31.0-31.9, adult: Secondary | ICD-10-CM | POA: Diagnosis not present

## 2024-02-18 DIAGNOSIS — M5416 Radiculopathy, lumbar region: Secondary | ICD-10-CM | POA: Diagnosis not present

## 2024-03-19 DIAGNOSIS — M5416 Radiculopathy, lumbar region: Secondary | ICD-10-CM | POA: Diagnosis not present

## 2024-04-10 ENCOUNTER — Ambulatory Visit (INDEPENDENT_AMBULATORY_CARE_PROVIDER_SITE_OTHER): Admitting: Family Medicine

## 2024-04-10 VITALS — BP 147/86 | HR 80 | Resp 16 | Ht 66.0 in | Wt 189.0 lb

## 2024-04-10 DIAGNOSIS — Z6830 Body mass index (BMI) 30.0-30.9, adult: Secondary | ICD-10-CM

## 2024-04-10 DIAGNOSIS — M545 Low back pain, unspecified: Secondary | ICD-10-CM | POA: Diagnosis not present

## 2024-04-10 DIAGNOSIS — M79605 Pain in left leg: Secondary | ICD-10-CM

## 2024-04-10 DIAGNOSIS — G43009 Migraine without aura, not intractable, without status migrainosus: Secondary | ICD-10-CM

## 2024-04-10 DIAGNOSIS — E66811 Obesity, class 1: Secondary | ICD-10-CM

## 2024-04-10 MED ORDER — PHENTERMINE HCL 37.5 MG PO CAPS
37.5000 mg | ORAL_CAPSULE | ORAL | 0 refills | Status: AC
Start: 2024-04-10 — End: ?

## 2024-04-10 MED ORDER — SUMATRIPTAN SUCCINATE 50 MG PO TABS: 50.0000 mg | ORAL_TABLET | ORAL | 0 refills | Status: AC | PRN

## 2024-04-10 MED ORDER — DICLOFENAC SODIUM 75 MG PO TBEC: 75.0000 mg | DELAYED_RELEASE_TABLET | ORAL | 0 refills | Status: AC | PRN

## 2024-04-10 MED ORDER — CYCLOBENZAPRINE HCL 5 MG PO TABS: 5.0000 mg | ORAL_TABLET | Freq: Three times a day (TID) | ORAL | 0 refills | Status: AC | PRN

## 2024-04-10 NOTE — Progress Notes (Signed)
 Established Patient Office Visit  Subjective:  Patient ID: Marissa Coleman, female    DOB: 1966-05-06  Age: 58 y.o. MRN: [id number]  CC:  Chief Complaint  Patient presents with   Obesity    1 month follow up     HPI Marissa Coleman is a 58 y.o. female with obesity presents for f/u. For the details of today's visit, please refer to the assessment and plan.    No past medical history on file.  Past Surgical History:  Procedure Laterality Date   BREAST CYST ASPIRATION Right 04/05/2016    Family History  Problem Relation Age of Onset   Breast cancer Mother 17   Breast cancer Maternal Aunt 5    Social History   Socioeconomic History   Marital status: Married    Spouse name: Not on file   Number of children: Not on file   Years of education: Not on file   Highest education level: 12th grade  Occupational History   Not on file  Tobacco Use   Smoking status: Never   Smokeless tobacco: Not on file  Vaping Use   Vaping status: Never Used  Substance and Sexual Activity   Alcohol use: Not Currently   Drug use: Not Currently   Sexual activity: Not Currently  Other Topics Concern   Not on file  Social History Narrative   Not on file   Social Drivers of Health   Financial Resource Strain: Low Risk  (10/28/2023)   Overall Financial Resource Strain (CARDIA)    Difficulty of Paying Living Expenses: Not very hard  Food Insecurity: Food Insecurity Present (10/28/2023)   Hunger Vital Sign    Worried About Running Out of Food in the Last Year: Sometimes true    Ran Out of Food in the Last Year: Sometimes true  Transportation Needs: No Transportation Needs (10/28/2023)   PRAPARE - Administrator, Civil Service (Medical): No    Lack of Transportation (Non-Medical): No  Physical Activity: Inactive (10/28/2023)   Exercise Vital Sign    Days of Exercise per Week: 0 days    Minutes of Exercise per Session: 60 min  Stress: No Stress Concern Present (10/28/2023)    [employer] of Occupational Health - Occupational Stress Questionnaire    Feeling of Stress : Only a little  Social Connections: Moderately Isolated (10/28/2023)   Social Connection and Isolation Panel [NHANES]    Frequency of Communication with Friends and Family: Once a week    Frequency of Social Gatherings with Friends and Family: Once a week    Attends Religious Services: 1 to 4 times per year    Active Member of [employer] or Organizations: No    Attends Engineer, structural: Not on file    Marital Status: Married  Intimate Partner Violence: Unknown (02/15/2022)   Received from [employer], [facility]   HITS    Physically Hurt: Not on file    Insult or Talk Down To: Not on file    Threaten Physical Harm: Not on file    Scream or Curse: Not on file    Outpatient Medications Prior to Visit  Medication Sig Dispense Refill   Cholecalciferol (VITAMIN D3) 3000 UNITS TABS Take by mouth.     EPINEPHrine  0.3 mg/0.3 mL IJ SOAJ injection Inject 0.3 mg into the muscle as needed for anaphylaxis. 1 each 3   gabapentin (NEURONTIN) 300 MG capsule Take 300 mg by mouth 2 (two) times daily.  Magnesium Oxide 200 MG TABS See admin instructions.     oxybutynin  (DITROPAN -XL) 5 MG 24 hr tablet Take 1 tablet (5 mg total) by mouth at bedtime. 30 tablet 0   oxyCODONE-acetaminophen (PERCOCET/ROXICET) 5-325 MG tablet Take 1 tablet by mouth every 6 (six) hours as needed for severe pain (pain score 7-10).     phentermine  30 MG capsule Take 1 capsule (30 mg total) by mouth every morning. 30 capsule 1   Riboflavin 400 MG CAPS      SPIKEVAX syringe Inject 0.5 mLs into the muscle once.     cyclobenzaprine  (FLEXERIL ) 5 MG tablet Take 1 tablet by mouth 3 (three) times daily as needed.     diclofenac  (VOLTAREN ) 75 MG EC tablet Take 1 tablet (75 mg total) by mouth as needed for mild pain (pain score 1-3) or moderate pain (pain score 4-6). 90 tablet 0   SUMAtriptan  (IMITREX ) 50 MG tablet Take 1 tablet  (50 mg total) by mouth every 2 (two) hours as needed for migraine. 10 tablet 0   No facility-administered medications prior to visit.    Allergies  Allergen Reactions   Shellfish Allergy Anaphylaxis   Iodinated Contrast Media Other (See Comments)    Uncoded Allergy. Allergen: shell fish   Iodine    Penicillins    Morphine Nausea And Vomiting   Penicillin G Rash    ROS Review of Systems  Constitutional:  Negative for chills and fever.  Eyes:  Negative for visual disturbance.  Respiratory:  Negative for chest tightness and shortness of breath.   Neurological:  Negative for dizziness and headaches.      Objective:     Physical Exam HENT:     Head: Normocephalic.     Mouth/Throat:     Mouth: Mucous membranes are moist.  Cardiovascular:     Rate and Rhythm: Normal rate.     Heart sounds: Normal heart sounds.  Pulmonary:     Effort: Pulmonary effort is normal.     Breath sounds: Normal breath sounds.  Neurological:     Mental Status: She is alert.     BP (!) 147/86   Pulse 80   Resp 16   Ht 5\' 6"  (1.676 m)   Wt 189 lb (85.7 kg)   LMP 04/29/2017 (Exact Date)   BMI 30.51 kg/m  Wt Readings from Last 3 Encounters:  04/10/24 189 lb (85.7 kg)  01/28/24 198 lb 1.9 oz (89.9 kg)  10/29/23 208 lb 1.3 oz (94.4 kg)    Lab Results  Component Value Date   TSH 0.784 01/26/2023   Lab Results  Component Value Date   WBC 4.2 01/23/2024   HGB 12.6 01/23/2024   HCT 38.2 01/23/2024   MCV 99 (H) 01/23/2024   PLT 212 01/23/2024   Lab Results  Component Value Date   NA 140 01/23/2024   K 3.9 01/23/2024   CO2 25 01/23/2024   GLUCOSE 88 01/23/2024   BUN 10 01/23/2024   CREATININE 0.98 01/23/2024   BILITOT 0.4 01/23/2024   ALKPHOS 73 01/23/2024   AST 19 01/23/2024   ALT 12 01/23/2024   PROT 6.4 01/23/2024   ALBUMIN 3.9 01/23/2024   CALCIUM 8.6 (L) 01/23/2024   EGFR 68 01/23/2024   Lab Results  Component Value Date   CHOL 179 01/23/2024   Lab Results   Component Value Date   HDL 49 01/23/2024   Lab Results  Component Value Date   LDLCALC 112 (H) 01/23/2024   Lab  Results  Component Value Date   TRIG 99 01/23/2024   Lab Results  Component Value Date   CHOLHDL 3.7 01/23/2024   Lab Results  Component Value Date   HGBA1C 5.6 01/26/2023      Assessment & Plan:  Obesity (BMI 30.0-34.9) Assessment & Plan: The patient has lost 9 pounds since starting Phentermine . She is currently taking Phentermine  30 mg daily and denies any side effects, reporting that she tolerates the medication well. She was encouraged to implement a heart-healthy diet and increase physical activity for optimal results. The dose will be increased to Phentermine  37.5 mg daily starting today, and she will follow up in one month to assess progress and tolerance. Wt Readings from Last 3 Encounters:  04/10/24 189 lb (85.7 kg)  01/28/24 198 lb 1.9 oz (89.9 kg)  10/29/23 208 lb 1.3 oz (94.4 kg)     Orders: -     Phentermine  HCl; Take 1 capsule (37.5 mg total) by mouth every morning.  Dispense: 30 capsule; Refill: 0  Lumbar pain with radiation down left leg -     Diclofenac  Sodium; Take 1 tablet (75 mg total) by mouth as needed for mild pain (pain score 1-3) or moderate pain (pain score 4-6).  Dispense: 90 tablet; Refill: 0 -     Cyclobenzaprine  HCl; Take 1 tablet (5 mg total) by mouth 3 (three) times daily as needed.  Dispense: 30 tablet; Refill: 0  Migraine without aura, not intractable, without status migrainosus -     SUMAtriptan  Succinate; Take 1 tablet (50 mg total) by mouth every 2 (two) hours as needed for migraine.  Dispense: 10 tablet; Refill: 0  Note: This chart has been completed using Engineer, civil (consulting) software, and while attempts have been made to ensure accuracy, certain words and phrases may not be transcribed as intended.    Follow-up: Return in about 1 month (around 05/11/2024).   Evie Crumpler, FNP

## 2024-04-10 NOTE — Assessment & Plan Note (Signed)
 The patient has lost 9 pounds since starting Phentermine . She is currently taking Phentermine  30 mg daily and denies any side effects, reporting that she tolerates the medication well. She was encouraged to implement a heart-healthy diet and increase physical activity for optimal results. The dose will be increased to Phentermine  37.5 mg daily starting today, and she will follow up in one month to assess progress and tolerance. Wt Readings from Last 3 Encounters:  04/10/24 189 lb (85.7 kg)  01/28/24 198 lb 1.9 oz (89.9 kg)  10/29/23 208 lb 1.3 oz (94.4 kg)

## 2024-04-10 NOTE — Patient Instructions (Signed)
 I appreciate the opportunity to provide care to you today!    Follow up:  1 months  Start taking phentermine  37.5 mg daily. For optimal results with weight loss, I recommend:  Decreasing portion sizes. Reducing sugar, sodium, and carbohydrate intake, and limiting saturated fats in your diet. Increasing your fiber intake by incorporating more whole grains, fruits, and vegetables. Setting healthy goals and focusing on lowering carbs, sugar, and fat. Increasing the variety of fruits and vegetables in your diet. Reducing soda consumption and limiting processed foods. In addition to taking your weight loss medication, engage in moderate-intensity physical activity for at least 150 minutes per week for the best results.    It was a pleasure to see you and I look forward to continuing to work together on your health and well-being. Please do not hesitate to call the office if you need care or have questions about your care.  In case of emergency, please visit the Emergency Department for urgent care, or contact our clinic at [phone] to schedule an appointment. We're here to help you!   Have a wonderful day and week. With Gratitude, Michole Lecuyer MSN, FNP-BC

## 2024-04-11 ENCOUNTER — Other Ambulatory Visit (HOSPITAL_COMMUNITY): Payer: Self-pay

## 2024-04-11 ENCOUNTER — Telehealth: Payer: Self-pay | Admitting: Pharmacy Technician

## 2024-04-11 NOTE — Telephone Encounter (Signed)
 Mychart message sent to patient.

## 2024-04-11 NOTE — Telephone Encounter (Signed)
 Pharmacy Patient Advocate Encounter   Received notification from Onbase that prior authorization for Phentermine  HCl 37.5MG  capsules is required/requested.   Insurance verification completed.   The patient is insured through [facility] .   Per test claim:   Weight loss drugs are excluded from coverage. Patient can utilize a GoodRx coupon for potential savings on her out of pocket cost.

## 2024-05-19 ENCOUNTER — Other Ambulatory Visit (HOSPITAL_COMMUNITY): Payer: Self-pay | Admitting: Family Medicine

## 2024-05-19 DIAGNOSIS — Z1231 Encounter for screening mammogram for malignant neoplasm of breast: Secondary | ICD-10-CM

## 2024-05-26 ENCOUNTER — Ambulatory Visit (HOSPITAL_COMMUNITY)
Admission: RE | Admit: 2024-05-26 | Discharge: 2024-05-26 | Disposition: A | Discharge status: Home / Self Care | Source: Ambulatory Visit | Attending: Family Medicine | Admitting: Family Medicine

## 2024-05-26 DIAGNOSIS — Z1231 Encounter for screening mammogram for malignant neoplasm of breast: Secondary | ICD-10-CM | POA: Diagnosis present

## 2024-06-02 ENCOUNTER — Ambulatory Visit: Admitting: Family Medicine

## 2024-06-27 ENCOUNTER — Ambulatory Visit: Payer: Self-pay | Admitting: Family Medicine

## 2024-08-11 ENCOUNTER — Ambulatory Visit: Payer: Self-pay | Admitting: Family Medicine
